# Patient Record
Sex: Male | Born: 1950 | Race: White | Hispanic: No | Marital: Married | Smoking: Former smoker
Health system: Southern US, Community
[De-identification: ages and names within clinical notes are randomized; demographics above are authoritative.]

## PROBLEM LIST (undated history)

## (undated) DIAGNOSIS — F419 Anxiety disorder, unspecified: Secondary | ICD-10-CM

## (undated) DIAGNOSIS — R112 Nausea with vomiting, unspecified: Secondary | ICD-10-CM

## (undated) DIAGNOSIS — G4730 Sleep apnea, unspecified: Secondary | ICD-10-CM

## (undated) DIAGNOSIS — Z8709 Personal history of other diseases of the respiratory system: Secondary | ICD-10-CM

## (undated) DIAGNOSIS — R51 Headache: Secondary | ICD-10-CM

## (undated) DIAGNOSIS — Z9889 Other specified postprocedural states: Secondary | ICD-10-CM

## (undated) DIAGNOSIS — I10 Essential (primary) hypertension: Secondary | ICD-10-CM

## (undated) DIAGNOSIS — R519 Headache, unspecified: Secondary | ICD-10-CM

## (undated) DIAGNOSIS — Z9109 Other allergy status, other than to drugs and biological substances: Secondary | ICD-10-CM

## (undated) DIAGNOSIS — C61 Malignant neoplasm of prostate: Secondary | ICD-10-CM

## (undated) DIAGNOSIS — Z87442 Personal history of urinary calculi: Secondary | ICD-10-CM

## (undated) HISTORY — PX: WISDOM TOOTH EXTRACTION: SHX21

## (undated) HISTORY — PX: UPPER GI ENDOSCOPY: SHX6162

## (undated) HISTORY — PX: NASAL SINUS SURGERY: SHX719

## (undated) HISTORY — PX: PROSTATE BIOPSY: SHX241

## (undated) HISTORY — PX: COLONOSCOPY: SHX174

---
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
## 1999-05-23 ENCOUNTER — Encounter: Payer: Self-pay | Admitting: *Deleted

## 1999-05-23 ENCOUNTER — Encounter: Admission: RE | Admit: 1999-05-23 | Discharge: 1999-05-23 | Payer: Self-pay | Admitting: *Deleted

## 1999-06-05 ENCOUNTER — Encounter: Payer: Self-pay | Admitting: *Deleted

## 1999-06-05 ENCOUNTER — Encounter: Admission: RE | Admit: 1999-06-05 | Discharge: 1999-06-05 | Payer: Self-pay | Admitting: *Deleted

## 2003-09-16 ENCOUNTER — Ambulatory Visit (HOSPITAL_COMMUNITY): Admission: RE | Admit: 2003-09-16 | Discharge: 2003-09-16 | Payer: Self-pay | Admitting: Gastroenterology

## 2007-06-05 ENCOUNTER — Encounter (INDEPENDENT_AMBULATORY_CARE_PROVIDER_SITE_OTHER): Payer: Self-pay | Admitting: Internal Medicine

## 2007-06-05 ENCOUNTER — Ambulatory Visit (HOSPITAL_COMMUNITY): Admission: RE | Admit: 2007-06-05 | Discharge: 2007-06-05 | Payer: Self-pay | Admitting: Internal Medicine

## 2010-09-21 NOTE — Op Note (Signed)
NAME:  Blake Ward, Blake Ward                         ACCOUNT NO.:  [id number]   MEDICAL RECORD NO.:  [id number]                   PATIENT TYPE:  AMB   LOCATION:  ENDO                                 FACILITY:  [facility]   PHYSICIAN:  John C. Madilyn Fireman, M.D.                 DATE OF BIRTH:  [birth year]   DATE OF PROCEDURE:  09/16/2003  DATE OF DISCHARGE:                                 OPERATIVE REPORT   PROCEDURE:  Colonoscopy.   ENDOSCOPIST:  Everardo All. Madilyn Fireman, M.D.   INDICATIONS FOR PROCEDURE:  Average risk colon cancer screening in a 60-year-  old patient with occasional rectal bleeding.   DESCRIPTION OF PROCEDURE:  The patient was placed in the left lateral  decubitus position and placed on the pulse monitor with the continuous low-  flow oxygen delivered by a nasal cannula.  He was sedated with 80 mcg of IV  fentanyl and 8 mg of IV Versed.  The Olympus video colonoscope was inserted  into the rectum and advanced to the cecum, confirmed by a transillumination  of McBurney's point and visualization of the ileocecal valve and appendiceal  orifice.  The prep was excellent.  The cecum, ascending, transverse,  descending and sigmoid colon all appeared normal, with no masses, polyps,  diverticula, or other mucosal abnormalities.  The rectum likewise appeared  normal.  The retroflexed view of the anus did reveals some small internal  hemorrhoids.  The scope was then withdrawn, and the patient returned to the  recovery room in stable condition.  He tolerated the procedure well, and  there were no immediate complications.   IMPRESSION:  Internal hemorrhoids, otherwise normal study.   PLAN:  The next colon screening plus a sigmoidoscopy in five years.                                               John C. Madilyn Fireman, M.D.    [facility]  D:  09/16/2003  T:  09/17/2003  Job:  [id number]   cc:   Gwen Pounds, M.D.  [address]  [place]  [place] [address]  Fax: [phone]

## 2011-04-06 DEATH — deceased

## 2017-04-29 ENCOUNTER — Other Ambulatory Visit: Payer: Self-pay

## 2017-04-29 ENCOUNTER — Encounter (HOSPITAL_BASED_OUTPATIENT_CLINIC_OR_DEPARTMENT_OTHER): Payer: Self-pay | Admitting: Emergency Medicine

## 2017-04-29 ENCOUNTER — Emergency Department (HOSPITAL_BASED_OUTPATIENT_CLINIC_OR_DEPARTMENT_OTHER)
Admission: EM | Admit: 2017-04-29 | Discharge: 2017-04-29 | Disposition: A | Discharge status: Home / Self Care | Payer: Medicare Other | Attending: Emergency Medicine | Admitting: Emergency Medicine

## 2017-04-29 ENCOUNTER — Emergency Department (HOSPITAL_BASED_OUTPATIENT_CLINIC_OR_DEPARTMENT_OTHER): Payer: Medicare Other

## 2017-04-29 DIAGNOSIS — R51 Headache: Secondary | ICD-10-CM | POA: Insufficient documentation

## 2017-04-29 DIAGNOSIS — Z79899 Other long term (current) drug therapy: Secondary | ICD-10-CM | POA: Diagnosis not present

## 2017-04-29 DIAGNOSIS — Z8546 Personal history of malignant neoplasm of prostate: Secondary | ICD-10-CM | POA: Diagnosis not present

## 2017-04-29 DIAGNOSIS — I10 Essential (primary) hypertension: Secondary | ICD-10-CM | POA: Insufficient documentation

## 2017-04-29 DIAGNOSIS — R519 Headache, unspecified: Secondary | ICD-10-CM

## 2017-04-29 HISTORY — DX: Essential (primary) hypertension: I10

## 2017-04-29 HISTORY — DX: Other allergy status, other than to drugs and biological substances: Z91.09

## 2017-04-29 LAB — CBC WITH DIFFERENTIAL/PLATELET
BASOS ABS: 0 10*3/uL (ref 0.0–0.1)
BASOS PCT: 0 %
EOS PCT: 0 %
Eosinophils Absolute: 0 10*3/uL (ref 0.0–0.7)
HEMATOCRIT: 41.2 % (ref 39.0–52.0)
Hemoglobin: 14.6 g/dL (ref 13.0–17.0)
LYMPHS PCT: 12 %
Lymphs Abs: 1.2 10*3/uL (ref 0.7–4.0)
MCH: 31.5 pg (ref 26.0–34.0)
MCHC: 35.4 g/dL (ref 30.0–36.0)
MCV: 88.8 fL (ref 78.0–100.0)
Monocytes Absolute: 0.3 10*3/uL (ref 0.1–1.0)
Monocytes Relative: 3 %
NEUTROS ABS: 8 10*3/uL — AB (ref 1.7–7.7)
Neutrophils Relative %: 85 %
PLATELETS: 213 10*3/uL (ref 150–400)
RBC: 4.64 MIL/uL (ref 4.22–5.81)
RDW: 12.4 % (ref 11.5–15.5)
WBC: 9.5 10*3/uL (ref 4.0–10.5)

## 2017-04-29 LAB — BASIC METABOLIC PANEL
ANION GAP: 10 (ref 5–15)
BUN: 9 mg/dL (ref 6–20)
CO2: 26 mmol/L (ref 22–32)
Calcium: 9.2 mg/dL (ref 8.9–10.3)
Chloride: 98 mmol/L — ABNORMAL LOW (ref 101–111)
Creatinine, Ser: 0.67 mg/dL (ref 0.61–1.24)
GLUCOSE: 152 mg/dL — AB (ref 65–99)
POTASSIUM: 3.2 mmol/L — AB (ref 3.5–5.1)
Sodium: 134 mmol/L — ABNORMAL LOW (ref 135–145)

## 2017-04-29 LAB — SEDIMENTATION RATE: Sed Rate: 8 mm/hr (ref 0–16)

## 2017-04-29 MED ORDER — IOPAMIDOL (ISOVUE-370) INJECTION 76%
100.0000 mL | Freq: Once | INTRAVENOUS | Status: AC | PRN
  Administered 2017-04-29: 100 mL via INTRAVENOUS

## 2017-04-29 MED ORDER — DIPHENHYDRAMINE HCL 50 MG/ML IJ SOLN
25.0000 mg | Freq: Once | INTRAMUSCULAR | Status: AC
  Administered 2017-04-29: 25 mg via INTRAVENOUS
  Filled 2017-04-29: qty 1

## 2017-04-29 MED ORDER — METOCLOPRAMIDE HCL 5 MG/ML IJ SOLN
10.0000 mg | Freq: Once | INTRAMUSCULAR | Status: AC
  Administered 2017-04-29: 10 mg via INTRAVENOUS
  Filled 2017-04-29: qty 2

## 2017-04-29 MED ORDER — VALPROATE SODIUM 500 MG/5ML IV SOLN
500.0000 mg | Freq: Once | INTRAVENOUS | Status: AC
  Administered 2017-04-29: 500 mg via INTRAVENOUS
  Filled 2017-04-29: qty 5

## 2017-04-29 MED ORDER — VALPROATE SODIUM 500 MG/5ML IV SOLN
INTRAVENOUS | Status: DC
Start: 2017-04-29 — End: 2017-04-29
  Filled 2017-04-29: qty 5

## 2017-04-29 MED ORDER — HYDRALAZINE HCL 20 MG/ML IJ SOLN
INTRAMUSCULAR | Status: DC
Start: 2017-04-29 — End: 2017-04-29
  Filled 2017-04-29: qty 1

## 2017-04-29 MED ORDER — HYDRALAZINE HCL 20 MG/ML IJ SOLN
10.0000 mg | Freq: Once | INTRAMUSCULAR | Status: DC
  Filled 2017-04-29: qty 1

## 2017-04-29 MED ORDER — HYDRALAZINE HCL 20 MG/ML IJ SOLN
10.0000 mg | Freq: Once | INTRAMUSCULAR | Status: AC
  Administered 2017-04-29: 10 mg via INTRAVENOUS
  Filled 2017-04-29: qty 1

## 2017-04-29 NOTE — ED Triage Notes (Signed)
Patient has had a Headache and dizziness with nausea for the last few days - patient states that nothing is helping with the Headaches now. The patient reports that the OTC medications have not worked recently

## 2017-04-29 NOTE — ED Provider Notes (Signed)
[facility] EMERGENCY DEPARTMENT Provider Note   CSN: [id number] Arrival date & time: 04/29/17  1453     History   Chief Complaint Chief Complaint  Patient presents with  . Headache    HPI Blake Ward is a 66 y.o. male. Chief complaint is headache  HPI:  66 year old male. He reports having headaches for the last 2 months. Global. Not unilateral. No vision changes. No numbness weakness or tingling to extremities. Occasionally he will feel dizziness described as "mild". However he denies lightheadedness, presyncope, or vertigo symptoms. No sudden onset of headache. States his headache is been worse the last 2 days. No neck pain or stiffness. No fever. Global, circumferential. He rates it 8 out of 10 today.  History of hypertension. He is on atenolol, and hydrochlorothiazide.  Past Medical History:  Diagnosis Date  . Cancer [facility])    prostate  . Environmental allergies   . Hypertension     There are no active problems to display for this patient.   History reviewed. No pertinent surgical history.     Home Medications    Prior to Admission medications   Medication Sig Start Date End Date Taking? Authorizing Provider  atenolol (TENORMIN) 100 MG tablet Take 100 mg by mouth daily.   Yes [provider]  metoprolol-hydrochlorothiazide (LOPRESSOR HCT) 50-25 MG tablet Take 1 tablet by mouth daily.   Yes [provider]    Family History History reviewed. No pertinent family history.  Social History Social History   Tobacco Use  . Smoking status: Never Smoker  . Smokeless tobacco: Never Used  Substance Use Topics  . Alcohol use: No    Frequency: Never  . Drug use: No     Allergies   Patient has no known allergies.   Review of Systems Review of Systems  Constitutional: Negative for appetite change, chills, diaphoresis, fatigue and fever.  HENT: Negative for mouth sores, sore throat and trouble swallowing.   Eyes: Negative for  visual disturbance.  Respiratory: Negative for cough, chest tightness, shortness of breath and wheezing.   Cardiovascular: Negative for chest pain.  Gastrointestinal: Negative for abdominal distention, abdominal pain, diarrhea, nausea and vomiting.  Endocrine: Negative for polydipsia, polyphagia and polyuria.  Genitourinary: Negative for dysuria, frequency and hematuria.  Musculoskeletal: Negative for gait problem.  Skin: Negative for color change, pallor and rash.  Neurological: Positive for dizziness and headaches. Negative for syncope and light-headedness.  Hematological: Does not bruise/bleed easily.  Psychiatric/Behavioral: Negative for behavioral problems and confusion.     Physical Exam Updated Vital Signs BP (!) 166/84   Pulse 76   Temp 98.7 F (37.1 C) (Oral)   Resp 18   Ht 6' (1.829 m)   Wt 90.7 kg (200 lb)   SpO2 99%   BMI 27.12 kg/m   Physical Exam  Constitutional: He is oriented to person, place, and time. He appears well-developed and well-nourished. No distress.  HENT:  Head: Normocephalic.  Normal scalp. Nontender over the temporal pulses. No erythema or rash. Neck supple. Normal cranial nerve exam.  Eyes: Conjunctivae are normal. Pupils are equal, round, and reactive to light. No scleral icterus.  Neck: Normal range of motion. Neck supple. No thyromegaly present.  Cardiovascular: Normal rate and regular rhythm. Exam reveals no gallop and no friction rub.  No murmur heard. Pulmonary/Chest: Effort normal and breath sounds normal. No respiratory distress. He has no wheezes. He has no rales.  Abdominal: Soft. Bowel sounds are normal. He exhibits no  distension. There is no tenderness. There is no rebound.  Musculoskeletal: Normal range of motion.  Neurological: He is alert and oriented to person, place, and time.  Skin: Skin is warm and dry. No rash noted.  Psychiatric: He has a normal mood and affect. His behavior is normal.     ED Treatments / Results   Labs (all labs ordered are listed, but only abnormal results are displayed) Labs Reviewed  CBC WITH DIFFERENTIAL/PLATELET - Abnormal; Notable for the following components:      Result Value   Neutro Abs 8.0 (*)    All other components within normal limits  BASIC METABOLIC PANEL - Abnormal; Notable for the following components:   Sodium 134 (*)    Potassium 3.2 (*)    Chloride 98 (*)    Glucose, Bld 152 (*)    All other components within normal limits  SEDIMENTATION RATE    EKG  EKG Interpretation  Date/Time:  Tuesday April 29 2017 15:10:59 EST Ventricular Rate:  57 PR Interval:    QRS Duration: 152 QT Interval:  489 QTC Calculation: 477 R Axis:   -74 Text Interpretation:  Sinus rhythm RBBB and LAFB Confirmed by Tanna Furry [phone] on 04/29/2017 3:38:48 PM       Radiology Ct Angio Head W Or Wo Contrast  Result Date: 04/29/2017 CLINICAL DATA:  Headache with dizziness and nausea for a few days. Cerebral hemorrhage suspected. EXAM: CT ANGIOGRAPHY HEAD TECHNIQUE: Multidetector CT imaging of the head was performed using the standard protocol during bolus administration of intravenous contrast. Multiplanar CT image reconstructions and MIPs were obtained to evaluate the vascular anatomy. CONTRAST:  186mL ISOVUE-370 IOPAMIDOL (ISOVUE-370) INJECTION 76% COMPARISON:  None. FINDINGS: CT HEAD Brain: No evidence of acute infarction, hemorrhage, hydrocephalus, extra-axial collection or mass lesion/mass effect. Patchy low-density in the cerebral white matter best attributed to chronic small vessel ischemia in this patient with history of hypertension. Vascular: See below Skull: No acute or aggressive finding Sinuses: Prior endoscopic sinus surgery. Maxillary sinuses are atelectatic. There are polypoid masses in the bilateral nasal cavity, left more than right, and in the frontal sinuses and ethmoid remnants. No fluid levels. Orbits: Negative CTA HEAD Anterior circulation: No branch occlusion,  stenosis, or beading. No evidence of aneurysm or vascular malformation. Posterior circulation: Hypoplastic/aplastic left P1 segment. Vessels are smooth and widely patent. Mild atherosclerotic plaque on the right V4 segment. Venous sinuses: Patent Anatomic variants: As above Delayed phase: No abnormal intracranial enhancement. IMPRESSION: 1. No acute finding to explain headache. 2. White matter disease, likely chronic small vessel ischemia. 3. Mild atherosclerotic plaque. 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 Electronically Signed   By: Monte Fantasia M.D.   On: 04/29/2017 17:03   Ct Head Wo Contrast  Result Date: 04/29/2017 CLINICAL DATA:  Headache with dizziness and nausea for a few days. Cerebral hemorrhage suspected. EXAM: CT ANGIOGRAPHY HEAD TECHNIQUE: Multidetector CT imaging of the head was performed using the standard protocol during bolus administration of intravenous contrast. Multiplanar CT image reconstructions and MIPs were obtained to evaluate the vascular anatomy. CONTRAST:  121mL ISOVUE-370 IOPAMIDOL (ISOVUE-370) INJECTION 76% COMPARISON:  None. FINDINGS: CT HEAD Brain: No evidence of acute infarction, hemorrhage, hydrocephalus, extra-axial collection or mass lesion/mass effect. Patchy low-density in the cerebral white matter best attributed to chronic small vessel ischemia in this patient with history of hypertension. Vascular: See below Skull: No acute or aggressive finding Sinuses: Prior endoscopic sinus surgery. Maxillary sinuses are atelectatic. There are polypoid masses  in the bilateral nasal cavity, left more than right, and in the frontal sinuses and ethmoid remnants. No fluid levels. Orbits: Negative CTA HEAD Anterior circulation: No branch occlusion, stenosis, or beading. No evidence of aneurysm or vascular malformation. Posterior circulation: Hypoplastic/aplastic left P1 segment. Vessels are smooth and widely patent. Mild atherosclerotic plaque on the right V4  segment. Venous sinuses: Patent Anatomic variants: As above Delayed phase: No abnormal intracranial enhancement. IMPRESSION: 1. No acute finding to explain headache. 2. White matter disease, likely chronic small vessel ischemia. 3. Mild atherosclerotic plaque. 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 Electronically Signed   By: Monte Fantasia M.D.   On: 04/29/2017 17:03    Procedures Procedures (including critical care time)  Medications Ordered in ED Medications  valproate (DEPACON) 500 MG/5ML injection (not administered)  hydrALAZINE (APRESOLINE) 20 MG/ML injection (not administered)  valproate (DEPACON) 500 MG/5ML injection (not administered)  metoCLOPramide (REGLAN) injection 10 mg (10 mg Intravenous Given 04/29/17 1550)  diphenhydrAMINE (BENADRYL) injection 25 mg (25 mg Intravenous Given 04/29/17 1547)  valproate (DEPACON) 500 mg in dextrose 5 % 50 mL IVPB (500 mg Intravenous New Bag/Given 04/29/17 1612)  hydrALAZINE (APRESOLINE) injection 10 mg (10 mg Intravenous Given 04/29/17 1600)  iopamidol (ISOVUE-370) 76 % injection 100 mL (100 mLs Intravenous Contrast Given 04/29/17 1623)     Initial Impression / Assessment and Plan / ED Course  I have reviewed the triage vital signs and the nursing notes.  Pertinent labs & imaging results that were available during my care of the patient were reviewed by me and considered in my medical decision making (see chart for details).    Headache. 2 months of headache with 48 hours worsening. Not highly suggestive of subarachnoid hemorrhage. No focal neurological deficits. No obvious or classic migraine symptoms. Plan imaging. If CT had normal plain CT angiogram. Symptomatic treatment. Reevaluation.  Final Clinical Impressions(s) / ED Diagnoses   Final diagnoses:  Acute nonintractable headache, unspecified headache type  Hypertension, unspecified type   Blood pressure improves the hydralazine 10 mg 1. Given Reglan, and  Depakote and his headache is down 22/10. CT his CT and your negative. Discussed with him that his CTs were reassuring. I'm not suspicious with the pretest probability that this is subarachnoid hemorrhage. His history of definite not consistently said headaches for several weeks and slowly worsening. No ill family members. Doubt carbon monoxide. Have asked him to check and record his blood pressure twice per day and to follow-up with his physician this week. He is taking new multiple supplements including tumor can others that he cannot recall. Asked him to discontinue all nonprescribed medications and double check restarting any of these with primary care. Return here if fevers or recurrent symptoms worsening symptoms.    ED Discharge Orders    None       Tanna Furry, MD 04/29/17 1724

## 2017-04-29 NOTE — Discharge Instructions (Signed)
Stop supplements. Check and record your blood pressure each morning and evening. Call your PCP for appointment this week.

## 2017-05-08 ENCOUNTER — Other Ambulatory Visit: Payer: Self-pay

## 2017-05-08 ENCOUNTER — Ambulatory Visit
Admission: RE | Admit: 2017-05-08 | Discharge: 2017-05-08 | Disposition: A | Discharge status: Home / Self Care | Payer: Medicare Other | Source: Ambulatory Visit | Attending: Radiation Oncology | Admitting: Radiation Oncology

## 2017-05-08 ENCOUNTER — Encounter: Payer: Self-pay | Admitting: Radiation Oncology

## 2017-05-08 ENCOUNTER — Encounter: Payer: Self-pay | Admitting: Medical Oncology

## 2017-05-08 VITALS — BP 124/69 | HR 64 | Resp 18 | Ht 72.0 in | Wt 202.0 lb

## 2017-05-08 DIAGNOSIS — I10 Essential (primary) hypertension: Secondary | ICD-10-CM | POA: Diagnosis not present

## 2017-05-08 DIAGNOSIS — C61 Malignant neoplasm of prostate: Secondary | ICD-10-CM | POA: Diagnosis not present

## 2017-05-08 DIAGNOSIS — Z79899 Other long term (current) drug therapy: Secondary | ICD-10-CM | POA: Diagnosis not present

## 2017-05-08 DIAGNOSIS — Z87891 Personal history of nicotine dependence: Secondary | ICD-10-CM | POA: Insufficient documentation

## 2017-05-08 DIAGNOSIS — F419 Anxiety disorder, unspecified: Secondary | ICD-10-CM | POA: Insufficient documentation

## 2017-05-08 HISTORY — DX: Malignant neoplasm of prostate: C61

## 2017-05-08 HISTORY — DX: Anxiety disorder, unspecified: F41.9

## 2017-05-08 NOTE — Progress Notes (Signed)
Radiation Oncology         [id number] ________________________________  Initial outpatient Consultation  Name: Blake Ward MRN: [id number]  Date: 05/08/2017  DOB: June 24, 1950  CC:Blake Ruff, MD  Blake Retort, MD   REFERRING PHYSICIAN: Nickie Retort, MD  DIAGNOSIS: 67 y.o. gentleman with stage T1c adenocarcinoma of the prostate with a Gleason's score of 3+4 and a PSA of 6.19    ICD-10-CM   1. Malignant neoplasm of prostate ([place]) C61     HISTORY OF PRESENT ILLNESS::Blake Ward is a 67 y.o. gentleman.  He was noted to have an elevated PSA of 4.1 by his primary care physician, Dr. Drema Ward in 06/2015.  He was referred for evaluation in Urology with Dr. Pilar Ward in March of 2017, digital rectal examination was performed at that time revealing symmetrical prostate lobes without nodularity and a repeat PSA was 3.51.  The decision was made at that time to postpone TRUSPBx and repeat PSA in 6 months.   A repeat PSA in September 2017 was 6.65. Despite the recommendation to proceed with TRUSPBx at that time, the patient elected to undergo surveillance, as his wife was undergoing multiple eye surgeries and medical issues. Repeat PSA in September 2018 showed a persistent elevation at 6.19 although DRE on 02/04/17 remained non-concerning.  The patient proceeded to transrectal ultrasound with 12 biopsies of the prostate on 04/15/17.  The prostate volume measured 32.58 cc.  Out of 12 core biopsies,2 were positive.  The maximum Gleason score was 3+4, and this was seen in left base lateral. There was a asingle focus of Gleason 3+3 disease in the right base lateral.    The patient reviewed the biopsy results with his urologist and he has kindly been referred today for discussion of potential radiation treatment options.  He is accompanied by his wife.   PREVIOUS RADIATION THERAPY: No  PAST MEDICAL HISTORY:  has a past medical history of Anxiety, Environmental allergies, Hypertension,  and Prostate cancer ([place]).    PAST SURGICAL HISTORY: Past Surgical History:  Procedure Laterality Date  . HERNIA REPAIR    . NOSE SURGERY    . PROSTATE BIOPSY      FAMILY HISTORY: family history includes Cancer in his brother. Brother died of colangiocarcinoma at age 106.  SOCIAL HISTORY:  reports that he quit smoking about 21 years ago. His smoking use included cigarettes. He has a 15.00 pack-year smoking history. he has never used smokeless tobacco. He reports that he does not drink alcohol or use drugs.  ALLERGIES: Patient has no known allergies.  MEDICATIONS:  Current Outpatient Medications  Medication Sig Dispense Refill  . atenolol (TENORMIN) 25 MG tablet     . DULoxetine (CYMBALTA) 30 MG capsule     . fluticasone (FLONASE) 50 MCG/ACT nasal spray     . hydrochlorothiazide (HYDRODIURIL) 25 MG tablet     . irbesartan (AVAPRO) 150 MG tablet Take 150 mg by mouth daily.  1   No current facility-administered medications for this encounter.     REVIEW OF SYSTEMS: On review of systems, the patient reports that he is doing well overall. He denies any chest pain, shortness of breath, cough, fevers, chills, night sweats, unintended weight changes. He denies any bowel or bladder disturbances, and denies abdominal pain, nausea or vomiting. He denies any new musculoskeletal or joint aches or pains, new skin lesions or concerns. The patient completed an IPSS and IIEF questionnaire.  His IPSS score was 1 indicating mild urinary outflow  obstructive symptoms.  He has severe ED with SHIM score of 5 but reports that this is not a priority at this time. A complete review of systems is obtained and is otherwise negative.     PHYSICAL EXAM:   height is 6' (1.829 m) and weight is 202 lb (91.6 kg). His blood pressure is 124/69 and his pulse is 64. His respiration is 18 and oxygen saturation is 99%.    In general this is a well appearing caucasian male in no acute distress. He is alert and oriented x4  and appropriate throughout the examination. HEENT reveals that the patient is normocephalic, atraumatic. EOMs are intact. PERRLA. Skin is intact without any evidence of gross lesions. Cardiovascular exam reveals a regular rate and rhythm, no clicks rubs or murmurs are auscultated. Chest is clear to auscultation bilaterally. Lymphatic assessment is performed and does not reveal any adenopathy in the cervical, supraclavicular, axillary, or inguinal chains. Abdomen has active bowel sounds in all quadrants and is intact. The abdomen is soft, non tender, non distended. Lower extremities are negative for pretibial pitting edema, deep calf tenderness, cyanosis or clubbing.  KPS = 100  100 - Normal; no complaints; no evidence of disease. 90   - Able to carry on normal activity; minor signs or symptoms of disease. 80   - Normal activity with effort; some signs or symptoms of disease. 81   - Cares for self; unable to carry on normal activity or to do active work. 60   - Requires occasional assistance, but is able to care for most of his personal needs. 50   - Requires considerable assistance and frequent medical care. 12   - Disabled; requires special care and assistance. 74   - Severely disabled; hospital admission is indicated although death not imminent. 55   - Very sick; hospital admission necessary; active supportive treatment necessary. 10   - Moribund; fatal processes progressing rapidly. 0     - Dead  Karnofsky DA, Abelmann [place], Craver LS and Burchenal [facility] [phone]) The use of the nitrogen mustards in the palliative treatment of carcinoma: with particular reference to bronchogenic carcinoma Cancer 1 634-56   LABORATORY DATA:  Lab Results  Component Value Date   WBC 9.5 04/29/2017   HGB 14.6 04/29/2017   HCT 41.2 04/29/2017   MCV 88.8 04/29/2017   PLT 213 04/29/2017   Lab Results  Component Value Date   NA 134 (L) 04/29/2017   K 3.2 (L) 04/29/2017   CL 98 (L) 04/29/2017   CO2 26 04/29/2017     No results found for: ALT, AST, GGT, ALKPHOS, BILITOT   RADIOGRAPHY: Ct Angio Head W Or Wo Contrast  Result Date: 04/29/2017 CLINICAL DATA:  Headache with dizziness and nausea for a few days. Cerebral hemorrhage suspected. EXAM: CT ANGIOGRAPHY HEAD TECHNIQUE: Multidetector CT imaging of the head was performed using the standard protocol during bolus administration of intravenous contrast. Multiplanar CT image reconstructions and MIPs were obtained to evaluate the vascular anatomy. CONTRAST:  148mL ISOVUE-370 IOPAMIDOL (ISOVUE-370) INJECTION 76% COMPARISON:  None. FINDINGS: CT HEAD Brain: No evidence of acute infarction, hemorrhage, hydrocephalus, extra-axial collection or mass lesion/mass effect. Patchy low-density in the cerebral white matter best attributed to chronic small vessel ischemia in this patient with history of hypertension. Vascular: See below Skull: No acute or aggressive finding Sinuses: Prior endoscopic sinus surgery. Maxillary sinuses are atelectatic. There are polypoid masses in the bilateral nasal cavity, left more than right, and in the frontal sinuses  and ethmoid remnants. No fluid levels. Orbits: Negative CTA HEAD Anterior circulation: No branch occlusion, stenosis, or beading. No evidence of aneurysm or vascular malformation. Posterior circulation: Hypoplastic/aplastic left P1 segment. Vessels are smooth and widely patent. Mild atherosclerotic plaque on the right V4 segment. Venous sinuses: Patent Anatomic variants: As above Delayed phase: No abnormal intracranial enhancement. IMPRESSION: 1. No acute finding to explain headache. 2. White matter disease, likely chronic small vessel ischemia. 3. Mild atherosclerotic plaque. 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 Electronically Signed   By: Monte Fantasia M.D.   On: 04/29/2017 17:03   Ct Head Wo Contrast  Result Date: 04/29/2017 CLINICAL DATA:  Headache with dizziness and nausea for a few days. Cerebral  hemorrhage suspected. EXAM: CT ANGIOGRAPHY HEAD TECHNIQUE: Multidetector CT imaging of the head was performed using the standard protocol during bolus administration of intravenous contrast. Multiplanar CT image reconstructions and MIPs were obtained to evaluate the vascular anatomy. CONTRAST:  187mL ISOVUE-370 IOPAMIDOL (ISOVUE-370) INJECTION 76% COMPARISON:  None. FINDINGS: CT HEAD Brain: No evidence of acute infarction, hemorrhage, hydrocephalus, extra-axial collection or mass lesion/mass effect. Patchy low-density in the cerebral white matter best attributed to chronic small vessel ischemia in this patient with history of hypertension. Vascular: See below Skull: No acute or aggressive finding Sinuses: Prior endoscopic sinus surgery. Maxillary sinuses are atelectatic. There are polypoid masses in the bilateral nasal cavity, left more than right, and in the frontal sinuses and ethmoid remnants. No fluid levels. Orbits: Negative CTA HEAD Anterior circulation: No branch occlusion, stenosis, or beading. No evidence of aneurysm or vascular malformation. Posterior circulation: Hypoplastic/aplastic left P1 segment. Vessels are smooth and widely patent. Mild atherosclerotic plaque on the right V4 segment. Venous sinuses: Patent Anatomic variants: As above Delayed phase: No abnormal intracranial enhancement. IMPRESSION: 1. No acute finding to explain headache. 2. White matter disease, likely chronic small vessel ischemia. 3. Mild atherosclerotic plaque. 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 Electronically Signed   By: Monte Fantasia M.D.   On: 04/29/2017 17:03      IMPRESSION: This gentleman is 67 years old with stage T1c adenocarcinoma of the prostate, a Gleason's score of 3+4 and a PSA of 6.19.  His T-Stage, Gleason's Score, and PSA put him into the favorable intermediate risk group.  Accordingly he is eligible for a variety of potential treatment options including external beam radiation,  brachytherapy, and prostatectomy.  PLAN: Today, we reviewed the findings and workup thus far.  We discussed the natural history of prostate cancer.  We reviewed the the implications of T-stage, Gleason's Score, and PSA on decision-making and outcomes in prostate cancer.  We discussed radiation treatment in the management of prostate cancer with regard to the logistics and delivery of external beam radiation treatment as well as the logistics and delivery of prostate brachytherapy.  We also discussed the role of SpaceOAR in reducing the rectal toxicity associated with radiotherapy. We compared and contrasted each of these approaches and also compared these against prostatectomy.  The patient is interested in prostate brachytherapy with SpaceOAR but is still considering prostatectomy.  He would like some additional time to consider his options before committing.    We will share our findings with Dr. Pilar Ward and will plan to follow up by phone in the next 2 weeks to answer any additional questions that he may have and assess his readiness to commit to treatment at that time.  We enjoyed meeting with him and his wife today, and  will look forward to participating in the care of this very nice gentleman.  We spent 60 minutes face to face with the patient and more than 50% of that time was spent in counseling and/or coordination of care.     Nicholos Johns, PA-C    Tyler Pita, MD  [facility] Direct Dial: [phone]  Fax: [phone] [email or web address]  Skype  LinkedIn    Page Me      This document serves as a record of services personally performed by Tyler Pita, MD and Freeman Caldron, PA-C. It was created on their behalf by Bethann Humble, a trained medical scribe. The creation of this record is based on the scribe's personal observations and the provider's statements to them. This document has been checked and approved by the attending provider.

## 2017-05-08 NOTE — Progress Notes (Signed)
See progress note under physician encounter. 

## 2017-05-08 NOTE — Progress Notes (Signed)
GU Location of Tumor / Histology: prostatic adenocarcinoma  If Prostate Cancer, Gleason Score is (3 + 4) and PSA is (6.19). Prostate volume: 32.58 grams.  Leontine Locket was referred by Dr. [name] to Dr. Pilar Jarvis in October 2018 for evaluation of an elevated PSA.   Sept. 2018  PSA  6.19 Sept 2017  PSA  6.65 March 2017  PSA  3.51  Biopsies of prostate (if applicable) revealed:    Past/Anticipated interventions by urology, if any: biopsy, referral to Dr. Tammi Klippel for consideration of radiation therapy  Past/Anticipated interventions by medical oncology, if any: no  Weight changes, if any: no  Bowel/Bladder complaints, if any: IPSS 1 with nocturia. Denies dysuria, hematuria, urinary leakage or incontinence.   Nausea/Vomiting, if any: no  Pain issues, if any:  no  SAFETY ISSUES:  Prior radiation? no  Pacemaker/ICD? no  Possible current pregnancy? no  Is the patient on methotrexate? no  Current Complaints / other details:  68 year old male. Married with one daughter.   Seeds vs. Surgery.

## 2017-06-06 ENCOUNTER — Telehealth: Payer: Self-pay | Admitting: Urology

## 2017-06-06 NOTE — Telephone Encounter (Signed)
I spoke with the patient again today in follow up regarding his decision for prostate cancer treatment.  I had spoken with him 2 weeks earlier and he was still undecided and today he reports that he remains undecided.  He is consulting with multiple acquaintances that have been treated for prostate cancer to gather further information.  He has a scheduled f/u visit with Dr. Pilar Jarvis 06/18/17 and is hopeful that he will be able to make a final decision for treatment at that time but is really looking for Dr. Pilar Jarvis to make a recommendation one way or the other as to whether he should have prostatectomy or brachytherapy.  At this time, he is leaning towards having prostatectomy because he likes the idea of having radiotherapy as a back up option in the future should he have a recurrence of his disease.  I advised that I will follow up with him by phone again sometime in the week of 06/23/17 but that he is welcome to call anytime in the interim if he reaches a decision sooner or has further questions.   Nicholos Johns, PA-C

## 2017-06-26 ENCOUNTER — Telehealth: Payer: Self-pay | Admitting: Medical Oncology

## 2017-06-26 NOTE — Telephone Encounter (Signed)
Left a message requesting a return call to discuss his decision for treatment of prostate cancer.

## 2017-07-01 ENCOUNTER — Telehealth: Payer: Self-pay | Admitting: Medical Oncology

## 2017-07-01 NOTE — Telephone Encounter (Signed)
Spoke with Blake Ward regarding his treatment decision. He states that Dr. Tammi Klippel, Ashlyn and Dr. Pilar Jarvis have been great discussing treatment options but he's frustrated because he has multiple treatment options. He has many friends that have been treated for prostate cancer with various treatments. He has discussed their outcomes and most have done well. We discussed his treatment options again as well as his PSA and pathology report. He has follow up with Dr. Pilar Jarvis 07/21/17 and will discuss the cure rate between surgery and brachytherapy. If  Dr. Pilar Jarvis agrees they are equal he is going with brachytherapy. I asked him to call me or Ashlyn with his decision. He voiced understanding.

## 2017-07-08 ENCOUNTER — Telehealth: Payer: Self-pay | Admitting: Urology

## 2017-07-08 NOTE — Telephone Encounter (Signed)
I spoke with this patient today by phone and he has reached his decision regarding treatment of his prostate cancer.  He wishes to move forward with prostate brachytherapy with SpaceOAR.  An inbox message was sent to Romie Jumper requesting that she move forward with coordinating this procedure with Dr. Carlynn Purl surgery schedulers.  The patient would like to have this procedure as soon as possible as he has a family vacation planned for mid June that he would like to be available for.  This information hs been shared with Dr. Pilar Jarvis as well. Ailene Ards

## 2017-08-13 ENCOUNTER — Telehealth: Payer: Self-pay | Admitting: *Deleted

## 2017-08-13 NOTE — Telephone Encounter (Signed)
CALLED PATIENT TO REMIND OF PRE-SEED APPTS. FOR 08-14-17, SPOKE WITH PATIENT AND HE IS AWARE OF THESE APPTS.

## 2017-08-14 ENCOUNTER — Ambulatory Visit
Admission: RE | Admit: 2017-08-14 | Discharge: 2017-08-14 | Disposition: A | Discharge status: Home / Self Care | Payer: Medicare Other | Source: Ambulatory Visit | Attending: Radiation Oncology | Admitting: Radiation Oncology

## 2017-08-14 ENCOUNTER — Encounter: Payer: Self-pay | Admitting: Medical Oncology

## 2017-08-14 DIAGNOSIS — Z923 Personal history of irradiation: Secondary | ICD-10-CM | POA: Diagnosis not present

## 2017-08-14 DIAGNOSIS — C61 Malignant neoplasm of prostate: Secondary | ICD-10-CM | POA: Diagnosis not present

## 2017-08-14 NOTE — Progress Notes (Signed)
  Radiation Oncology         [phone]) [phone] ________________________________  Name: Blake Ward MRN: [id number]  Date: 08/14/2017  DOB: 11-09-1950  SIMULATION AND TREATMENT PLANNING NOTE PUBIC ARCH STUDY  QT:MAUQJF, Benjamine Mola, MD  Nickie Retort, MD  DIAGNOSIS: 67 y.o. male with stage T1c adenocarcinoma of the prostate with a Gleason's score of 3+4 and a PSA of 6.19     ICD-10-CM   1. Malignant neoplasm of prostate ([place]) C61     COMPLEX SIMULATION:  The patient presented today for evaluation for possible prostate seed implant. He was brought to the radiation planning suite and placed supine on the CT couch. A 3-dimensional image study set was obtained in upload to the planning computer. There, on each axial slice, I contoured the prostate gland. Then, using three-dimensional radiation planning tools I reconstructed the prostate in view of the structures from the transperineal needle pathway to assess for possible pubic arch interference. In doing so, I did not appreciate any pubic arch interference. Also, the patient's prostate volume was estimated based on the drawn structure. The volume was 42 cc.  Given the pubic arch appearance and prostate volume, patient remains a good candidate to proceed with prostate seed implant. Today, he freely provided informed written consent to proceed.    PLAN: The patient will undergo prostate seed implant.   ________________________________  Sheral Apley. Tammi Klippel, M.D.  This document serves as a record of services personally performed by Tyler Pita, MD. It was created on his behalf by Rae Lips, a trained medical scribe. The creation of this record is based on the scribe's personal observations and the provider's statements to them. This document has been checked and approved by the attending provider.

## 2017-08-18 ENCOUNTER — Ambulatory Visit (HOSPITAL_COMMUNITY)
Admission: RE | Admit: 2017-08-18 | Discharge: 2017-08-18 | Disposition: A | Discharge status: Home / Self Care | Payer: Medicare Other | Source: Ambulatory Visit | Attending: Urology | Admitting: Urology

## 2017-08-18 ENCOUNTER — Other Ambulatory Visit: Payer: Self-pay | Admitting: Urology

## 2017-08-18 ENCOUNTER — Encounter (HOSPITAL_COMMUNITY)
Admission: RE | Admit: 2017-08-18 | Discharge: 2017-08-18 | Disposition: A | Discharge status: Home / Self Care | Payer: Medicare Other | Source: Ambulatory Visit | Attending: Urology | Admitting: Urology

## 2017-08-18 DIAGNOSIS — Z01818 Encounter for other preprocedural examination: Secondary | ICD-10-CM

## 2017-08-18 DIAGNOSIS — I444 Left anterior fascicular block: Secondary | ICD-10-CM | POA: Diagnosis not present

## 2017-08-18 DIAGNOSIS — I452 Bifascicular block: Secondary | ICD-10-CM | POA: Insufficient documentation

## 2017-08-18 DIAGNOSIS — I4510 Unspecified right bundle-branch block: Secondary | ICD-10-CM | POA: Insufficient documentation

## 2017-09-05 ENCOUNTER — Other Ambulatory Visit: Payer: Self-pay | Admitting: Urology

## 2017-09-05 DIAGNOSIS — C61 Malignant neoplasm of prostate: Secondary | ICD-10-CM

## 2017-09-12 ENCOUNTER — Encounter (HOSPITAL_BASED_OUTPATIENT_CLINIC_OR_DEPARTMENT_OTHER): Payer: Self-pay

## 2017-09-12 ENCOUNTER — Telehealth: Payer: Self-pay | Admitting: *Deleted

## 2017-09-12 ENCOUNTER — Other Ambulatory Visit: Payer: Self-pay

## 2017-09-12 NOTE — Telephone Encounter (Signed)
CALLED PATIENT TO REMIND OF LABS FOR 09-15-17 @ 2 PM @ [facility]ITT[facility] , SPOKE WITH PATIENT AND HE IS AWARE OF THIS APPT.

## 2017-09-12 NOTE — Progress Notes (Signed)
Spoke with:  Legrand Como NPO:  After Midnight, no gum, candy, or mints   Arrival time:  0730AM Labs/Procedures:  EKG/CXR 08/18/2017, CBC, CMP, PT, PTT 09/15/2017 in epic AM medications:  Fleet enema, Duloxetine, Flonase Pre op orders:  Yes Ride home:  Juliann Pulse (wife) [phone]

## 2017-09-15 ENCOUNTER — Encounter (HOSPITAL_COMMUNITY)
Admission: RE | Admit: 2017-09-15 | Discharge: 2017-09-15 | Disposition: A | Discharge status: Home / Self Care | Payer: Medicare Other | Source: Ambulatory Visit | Attending: Urology | Admitting: Urology

## 2017-09-15 DIAGNOSIS — Z01812 Encounter for preprocedural laboratory examination: Secondary | ICD-10-CM | POA: Diagnosis not present

## 2017-09-15 LAB — COMPREHENSIVE METABOLIC PANEL
ALT: 18 U/L (ref 17–63)
ANION GAP: 9 (ref 5–15)
AST: 24 U/L (ref 15–41)
Albumin: 3.9 g/dL (ref 3.5–5.0)
Alkaline Phosphatase: 59 U/L (ref 38–126)
BILIRUBIN TOTAL: 0.8 mg/dL (ref 0.3–1.2)
BUN: 9 mg/dL (ref 6–20)
CO2: 28 mmol/L (ref 22–32)
Calcium: 9.5 mg/dL (ref 8.9–10.3)
Chloride: 100 mmol/L — ABNORMAL LOW (ref 101–111)
Creatinine, Ser: 0.85 mg/dL (ref 0.61–1.24)
Glucose, Bld: 108 mg/dL — ABNORMAL HIGH (ref 65–99)
POTASSIUM: 4 mmol/L (ref 3.5–5.1)
Sodium: 137 mmol/L (ref 135–145)
TOTAL PROTEIN: 7 g/dL (ref 6.5–8.1)

## 2017-09-15 LAB — CBC
HCT: 40.2 % (ref 39.0–52.0)
Hemoglobin: 14 g/dL (ref 13.0–17.0)
MCH: 31.7 pg (ref 26.0–34.0)
MCHC: 34.8 g/dL (ref 30.0–36.0)
MCV: 91 fL (ref 78.0–100.0)
Platelets: 231 10*3/uL (ref 150–400)
RBC: 4.42 MIL/uL (ref 4.22–5.81)
RDW: 12.4 % (ref 11.5–15.5)
WBC: 5.1 10*3/uL (ref 4.0–10.5)

## 2017-09-15 LAB — PROTIME-INR
INR: 0.97
PROTHROMBIN TIME: 12.8 s (ref 11.4–15.2)

## 2017-09-15 LAB — APTT: aPTT: 30 seconds (ref 24–36)

## 2017-09-15 NOTE — Pre-Procedure Instructions (Signed)
CMP results 09/15/2017 faxed to Dr. Pilar Jarvis via epic.

## 2017-09-19 ENCOUNTER — Telehealth: Payer: Self-pay | Admitting: *Deleted

## 2017-09-19 NOTE — Telephone Encounter (Signed)
CALLED PATIENT TO REMIND OF IMPLANT FOR 09-22-17, SPOKE WITH PATIENT AND HE IS AWARE OF THIS PROCEDURE

## 2017-09-21 NOTE — Progress Notes (Signed)
  Radiation Oncology         [phone] ________________________________  Name: Blake Ward MRN: [id number]  Date: 09/21/2017  DOB: 11-03-1950       Prostate Seed Implant  CC:[name], MD  No ref. provider found  DIAGNOSIS: 67 y.o. gentleman with stage T1c adenocarcinoma of the prostate with a Gleason's score of 3+4 and a PSA of 6.19    ICD-10-CM   1. Preop testing Z01.818 DG Chest 2 View    DG Chest 2 View    PROCEDURE: Insertion of radioactive I-125 seeds into the prostate gland.  RADIATION DOSE: 145 Gy, definitive therapy.  TECHNIQUE: Blake Ward was brought to the operating room with the urologist. He was placed in the dorsolithotomy position. He was catheterized and a rectal tube was inserted. The perineum was shaved, prepped and draped. The ultrasound probe was then introduced into the rectum to see the prostate gland.  TREATMENT DEVICE: A needle grid was attached to the ultrasound probe stand and anchor needles were placed.  3D PLANNING: The prostate was imaged in 3D using a sagittal sweep of the prostate probe. These images were transferred to the planning computer. There, the prostate, urethra and rectum were defined on each axial reconstructed image. Then, the software created an optimized 3D plan and a few seed positions were adjusted. The quality of the plan was reviewed using [facility] information for the target and the following two organs at risk:  Urethra and Rectum.  Then the accepted plan was printed and handed off to the radiation therapist.  Under my supervision, the custom loading of the seeds and spacers was carried out and loaded into sealed vicryl sleeves.  These pre-loaded needles were then placed into the needle holder.Marland Kitchen  PROSTATE VOLUME STUDY:  Using transrectal ultrasound the volume of the prostate was verified to be 45.9 cc.  SPECIAL TREATMENT PROCEDURE/SUPERVISION AND HANDLING: The pre-loaded needles were then delivered under sagittal guidance. A  total of 21 needles were used to deposit 68 seeds in the prostate gland. The individual seed activity was 0.507 mCi.  SpaceOAR:  Yes  COMPLEX SIMULATION: At the end of the procedure, an anterior radiograph of the pelvis was obtained to document seed positioning and count. Cystoscopy was performed to check the urethra and bladder.  MICRODOSIMETRY: At the end of the procedure, the patient was emitting 0.179 mR/hr at 1 meter. Accordingly, he was considered safe for hospital discharge.  PLAN: The patient will return to the radiation oncology clinic for post implant CT dosimetry in three weeks.   ________________________________  Sheral Apley Tammi Klippel, M.D.

## 2017-09-22 ENCOUNTER — Encounter (HOSPITAL_BASED_OUTPATIENT_CLINIC_OR_DEPARTMENT_OTHER)
Admission: RE | Disposition: A | Discharge status: Home / Self Care | Payer: Self-pay | Source: Ambulatory Visit | Attending: Urology

## 2017-09-22 ENCOUNTER — Ambulatory Visit (HOSPITAL_BASED_OUTPATIENT_CLINIC_OR_DEPARTMENT_OTHER)
Admission: RE | Admit: 2017-09-22 | Discharge: 2017-09-22 | Disposition: A | Discharge status: Home / Self Care | Payer: Medicare Other | Source: Ambulatory Visit | Attending: Urology | Admitting: Urology

## 2017-09-22 ENCOUNTER — Ambulatory Visit (HOSPITAL_BASED_OUTPATIENT_CLINIC_OR_DEPARTMENT_OTHER): Payer: Medicare Other | Admitting: Anesthesiology

## 2017-09-22 ENCOUNTER — Ambulatory Visit (HOSPITAL_COMMUNITY): Payer: Medicare Other

## 2017-09-22 ENCOUNTER — Encounter (HOSPITAL_BASED_OUTPATIENT_CLINIC_OR_DEPARTMENT_OTHER): Payer: Self-pay

## 2017-09-22 DIAGNOSIS — Z79899 Other long term (current) drug therapy: Secondary | ICD-10-CM | POA: Diagnosis not present

## 2017-09-22 DIAGNOSIS — I10 Essential (primary) hypertension: Secondary | ICD-10-CM | POA: Insufficient documentation

## 2017-09-22 DIAGNOSIS — Z7951 Long term (current) use of inhaled steroids: Secondary | ICD-10-CM | POA: Diagnosis not present

## 2017-09-22 DIAGNOSIS — G4730 Sleep apnea, unspecified: Secondary | ICD-10-CM | POA: Insufficient documentation

## 2017-09-22 DIAGNOSIS — Z01818 Encounter for other preprocedural examination: Secondary | ICD-10-CM

## 2017-09-22 DIAGNOSIS — Z87891 Personal history of nicotine dependence: Secondary | ICD-10-CM | POA: Diagnosis not present

## 2017-09-22 DIAGNOSIS — F419 Anxiety disorder, unspecified: Secondary | ICD-10-CM | POA: Insufficient documentation

## 2017-09-22 DIAGNOSIS — Z7982 Long term (current) use of aspirin: Secondary | ICD-10-CM | POA: Insufficient documentation

## 2017-09-22 DIAGNOSIS — C61 Malignant neoplasm of prostate: Secondary | ICD-10-CM | POA: Diagnosis not present

## 2017-09-22 HISTORY — DX: Personal history of urinary calculi: Z87.442

## 2017-09-22 HISTORY — DX: Nausea with vomiting, unspecified: R11.2

## 2017-09-22 HISTORY — PX: CYSTOSCOPY: SHX5120

## 2017-09-22 HISTORY — PX: SPACE OAR INSTILLATION: SHX6769

## 2017-09-22 HISTORY — PX: RADIOACTIVE SEED IMPLANT: SHX5150

## 2017-09-22 HISTORY — DX: Headache, unspecified: R51.9

## 2017-09-22 HISTORY — DX: Other specified postprocedural states: Z98.890

## 2017-09-22 HISTORY — DX: Sleep apnea, unspecified: G47.30

## 2017-09-22 HISTORY — DX: Headache: R51

## 2017-09-22 HISTORY — DX: Personal history of other diseases of the respiratory system: Z87.09

## 2017-09-22 SURGERY — INSERTION, RADIATION SOURCE, PROSTATE
Anesthesia: General | Site: Rectum

## 2017-09-22 MED ORDER — LIDOCAINE 2% (20 MG/ML) 5 ML SYRINGE
INTRAMUSCULAR | Status: DC | PRN
  Administered 2017-09-22: 60 mg via INTRAVENOUS

## 2017-09-22 MED ORDER — EPHEDRINE SULFATE-NACL 50-0.9 MG/10ML-% IV SOSY
PREFILLED_SYRINGE | INTRAVENOUS | Status: DC | PRN
  Administered 2017-09-22 (×4): 10 mg via INTRAVENOUS

## 2017-09-22 MED ORDER — SODIUM CHLORIDE 0.9 % IR SOLN
Status: DC | PRN
  Administered 2017-09-22: 1000 mL

## 2017-09-22 MED ORDER — FENTANYL CITRATE (PF) 100 MCG/2ML IJ SOLN
25.0000 ug | INTRAMUSCULAR | Status: DC | PRN
  Filled 2017-09-22: qty 1

## 2017-09-22 MED ORDER — CIPROFLOXACIN IN D5W 400 MG/200ML IV SOLN
400.0000 mg | INTRAVENOUS | Status: AC
  Administered 2017-09-22: 400 mg via INTRAVENOUS
  Filled 2017-09-22: qty 200

## 2017-09-22 MED ORDER — SCOPOLAMINE 1 MG/3DAYS TD PT72
MEDICATED_PATCH | TRANSDERMAL | Status: AC
  Filled 2017-09-22: qty 1

## 2017-09-22 MED ORDER — SCOPOLAMINE 1 MG/3DAYS TD PT72
1.0000 | MEDICATED_PATCH | TRANSDERMAL | Status: DC
  Administered 2017-09-22: 1.5 mg via TRANSDERMAL
  Filled 2017-09-22: qty 1

## 2017-09-22 MED ORDER — FENTANYL CITRATE (PF) 100 MCG/2ML IJ SOLN
INTRAMUSCULAR | Status: AC
  Filled 2017-09-22: qty 2

## 2017-09-22 MED ORDER — PROMETHAZINE HCL 25 MG/ML IJ SOLN
6.2500 mg | INTRAMUSCULAR | Status: DC | PRN
  Filled 2017-09-22: qty 1

## 2017-09-22 MED ORDER — FLEET ENEMA 7-19 GM/118ML RE ENEM
1.0000 | ENEMA | Freq: Once | RECTAL | Status: DC
  Filled 2017-09-22: qty 1

## 2017-09-22 MED ORDER — DEXAMETHASONE SODIUM PHOSPHATE 10 MG/ML IJ SOLN
INTRAMUSCULAR | Status: DC | PRN
  Administered 2017-09-22: 10 mg via INTRAVENOUS

## 2017-09-22 MED ORDER — LIDOCAINE 2% (20 MG/ML) 5 ML SYRINGE
INTRAMUSCULAR | Status: AC
  Filled 2017-09-22: qty 5

## 2017-09-22 MED ORDER — IOHEXOL 300 MG/ML  SOLN
INTRAMUSCULAR | Status: DC | PRN
  Administered 2017-09-22: 7 mL

## 2017-09-22 MED ORDER — CEPHALEXIN 500 MG PO CAPS: 500.0000 mg | ORAL_CAPSULE | Freq: Three times a day (TID) | ORAL | 0 refills | Status: DC

## 2017-09-22 MED ORDER — ONDANSETRON HCL 4 MG/2ML IJ SOLN
INTRAMUSCULAR | Status: AC
  Filled 2017-09-22: qty 2

## 2017-09-22 MED ORDER — MIDAZOLAM HCL 2 MG/2ML IJ SOLN
INTRAMUSCULAR | Status: DC | PRN
  Administered 2017-09-22: 2 mg via INTRAVENOUS

## 2017-09-22 MED ORDER — SODIUM CHLORIDE 0.9 % IJ SOLN
INTRAMUSCULAR | Status: DC | PRN
  Administered 2017-09-22: 10 mL

## 2017-09-22 MED ORDER — PROPOFOL 10 MG/ML IV BOLUS
INTRAVENOUS | Status: AC
  Filled 2017-09-22: qty 20

## 2017-09-22 MED ORDER — DEXAMETHASONE SODIUM PHOSPHATE 10 MG/ML IJ SOLN
INTRAMUSCULAR | Status: AC
  Filled 2017-09-22: qty 1

## 2017-09-22 MED ORDER — PROPOFOL 10 MG/ML IV BOLUS
INTRAVENOUS | Status: DC | PRN
  Administered 2017-09-22: 180 mg via INTRAVENOUS

## 2017-09-22 MED ORDER — FENTANYL CITRATE (PF) 100 MCG/2ML IJ SOLN
INTRAMUSCULAR | Status: DC | PRN
  Administered 2017-09-22 (×2): 25 ug via INTRAVENOUS
  Administered 2017-09-22: 50 ug via INTRAVENOUS

## 2017-09-22 MED ORDER — ONDANSETRON HCL 4 MG/2ML IJ SOLN
INTRAMUSCULAR | Status: DC | PRN
  Administered 2017-09-22: 4 mg via INTRAVENOUS

## 2017-09-22 MED ORDER — EPHEDRINE SULFATE 50 MG/ML IJ SOLN
INTRAMUSCULAR | Status: AC
  Filled 2017-09-22: qty 1

## 2017-09-22 MED ORDER — CIPROFLOXACIN IN D5W 400 MG/200ML IV SOLN
INTRAVENOUS | Status: AC
  Filled 2017-09-22: qty 200

## 2017-09-22 MED ORDER — LACTATED RINGERS IV SOLN
INTRAVENOUS | Status: DC
  Administered 2017-09-22: 11:00:00 via INTRAVENOUS
  Administered 2017-09-22: 1000 mL via INTRAVENOUS
  Filled 2017-09-22: qty 1000

## 2017-09-22 MED ORDER — HYDROCODONE-ACETAMINOPHEN 5-325 MG PO TABS: 1.0000 | ORAL_TABLET | ORAL | 0 refills | Status: AC | PRN

## 2017-09-22 MED ORDER — MIDAZOLAM HCL 2 MG/2ML IJ SOLN
INTRAMUSCULAR | Status: AC
  Filled 2017-09-22: qty 2

## 2017-09-22 SURGICAL SUPPLY — 41 items
BAG URINE DRAINAGE (UROLOGICAL SUPPLIES) ×5 IMPLANT
BLADE CLIPPER SURG (BLADE) ×5 IMPLANT
CATH FOLEY 2WAY SLVR  5CC 16FR (CATHETERS) ×2
CATH FOLEY 2WAY SLVR 5CC 16FR (CATHETERS) ×3 IMPLANT
CATH ROBINSON RED A/P 16FR (CATHETERS) IMPLANT
CATH ROBINSON RED A/P 20FR (CATHETERS) ×5 IMPLANT
CLOTH BEACON ORANGE TIMEOUT ST (SAFETY) ×5 IMPLANT
CNTNR SPEC C3OZ STD GRAD LEK (MISCELLANEOUS) ×6 IMPLANT
CONT SPEC 3OZ W/LID STRL (MISCELLANEOUS) ×4
COVER BACK TABLE 60X90IN (DRAPES) ×5 IMPLANT
COVER MAYO STAND STRL (DRAPES) ×5 IMPLANT
DRSG TEGADERM 4X4.75 (GAUZE/BANDAGES/DRESSINGS) ×10 IMPLANT
DRSG TEGADERM 8X12 (GAUZE/BANDAGES/DRESSINGS) ×5 IMPLANT
GAUZE SPONGE 4X4 12PLY STRL (GAUZE/BANDAGES/DRESSINGS) ×5 IMPLANT
GLOVE BIO SURGEON STRL SZ 6.5 (GLOVE) ×8 IMPLANT
GLOVE BIO SURGEON STRL SZ7.5 (GLOVE) ×15 IMPLANT
GLOVE BIO SURGEON STRL SZ8 (GLOVE) ×5 IMPLANT
GLOVE BIO SURGEONS STRL SZ 6.5 (GLOVE) ×2
GLOVE BIOGEL PI IND STRL 6.5 (GLOVE) ×6 IMPLANT
GLOVE BIOGEL PI IND STRL 7.5 (GLOVE) ×6 IMPLANT
GLOVE BIOGEL PI IND STRL 8 (GLOVE) ×3 IMPLANT
GLOVE BIOGEL PI INDICATOR 6.5 (GLOVE) ×4
GLOVE BIOGEL PI INDICATOR 7.5 (GLOVE) ×4
GLOVE BIOGEL PI INDICATOR 8 (GLOVE) ×2
GLOVE ECLIPSE 8.0 STRL XLNG CF (GLOVE) ×20 IMPLANT
GOWN STRL REUS W/ TWL XL LVL3 (GOWN DISPOSABLE) ×3 IMPLANT
GOWN STRL REUS W/TWL XL LVL3 (GOWN DISPOSABLE) ×12 IMPLANT
HOLDER FOLEY CATH W/STRAP (MISCELLANEOUS) IMPLANT
IMPL SPACEOAR SYSTEM 10ML (MISCELLANEOUS) ×3 IMPLANT
IMPLANT SPACEOAR SYSTEM 10ML (MISCELLANEOUS) ×5
IV NS 1000ML (IV SOLUTION) ×2
IV NS 1000ML BAXH (IV SOLUTION) ×3 IMPLANT
KIT TURNOVER CYSTO (KITS) ×5 IMPLANT
MARKER SKIN DUAL TIP RULER LAB (MISCELLANEOUS) ×5 IMPLANT
PACK CYSTO (CUSTOM PROCEDURE TRAY) ×5 IMPLANT
SURGILUBE 2OZ TUBE FLIPTOP (MISCELLANEOUS) ×5 IMPLANT
SUT BONE WAX W31G (SUTURE) IMPLANT
SYRINGE 10CC LL (SYRINGE) ×10 IMPLANT
UNDERPAD 30X30 (UNDERPADS AND DIAPERS) ×10 IMPLANT
WATER STERILE IRR 500ML POUR (IV SOLUTION) ×5 IMPLANT
i-seed agx100 ×340 IMPLANT

## 2017-09-22 NOTE — Anesthesia Postprocedure Evaluation (Signed)
Anesthesia Post Note  Patient: Blake Ward  Procedure(s) Performed: RADIOACTIVE SEED IMPLANT/BRACHYTHERAPY IMPLANT (N/A Prostate) SPACE OAR INSTILLATION (N/A Rectum) CYSTOSCOPY FLEXIBLE (N/A Bladder)     Anesthesia Post Evaluation  Last Vitals:  Vitals:   09/22/17 1200 09/22/17 1228  BP: 136/76 135/80  Pulse: 65 65  Resp: 10 12  Temp:  36.5 C  SpO2: 100% 100%    Last Pain:  Vitals:   09/22/17 1228  TempSrc:   PainSc: 0-No pain                 Lesta Limbert DANIEL

## 2017-09-22 NOTE — Op Note (Signed)
Preoperative diagnosis: Adenocarcinoma of the prostate   Postoperative diagnosis: Same   Procedure: I-125 prostate seed implantation, cystoscopy, SpaceOar placement  Surgeon: Baruch Gouty, M.D.  Radiation oncologist: Tyler Pita, M.D.   Anesthesia: General  Drains: None  Complications: none  Indications: Clinically localized intermediate risk prostate cancer  Procedure: Patient was brought to operating suite and placement table in the supine position. At this time, a universal timeout protocol was performed, all team members were identified, Venodyne boots are placed, and he was administered IV Ancef in the preoperative period. He was placed in lithotomy position and prepped and draped in usual manner. Radiation oncology department placed a transrectal ultrasound probe anchoring stand/ grid and aligned with previous imaging from the volume study. Foley catheter was inserted without difficulty.  All needle passage was done with real-time transrectal ultrasound guidance in both the transverse and sagittal plains in order to achieve the desired preplanned position. A total of 21 needles were placed.  68 active seeds were implanted. SpaceOar was then placed in the anterior rectal fat after a test flush of normal saline to confirm the correct plane. There was good seperation of the prostate and rectum bilaterally after placement. The Foley catheter was removed and a flexible cystoscopy failed to show any seeds outside the prostate without evidence of trauma to the urethral, prostatic fossa, or bladder.  The bladder was drained.  A fluoroscopic image was then obtained showing excellent distrubution of the brachytherapy seeds.  Each seed was counted and counts were correct.    The patient was then repositioned in the supine position, reversed from anesthesia, and taken to the PACU in stable condition.

## 2017-09-22 NOTE — Transfer of Care (Signed)
Immediate Anesthesia Transfer of Care Note  Patient: Blake Ward  Procedure(s) Performed: Procedure(s) (LRB): RADIOACTIVE SEED IMPLANT/BRACHYTHERAPY IMPLANT (N/A) SPACE OAR INSTILLATION (N/A) CYSTOSCOPY FLEXIBLE (N/A)  Patient Location: PACU  Anesthesia Type: General  Level of Consciousness: awake, oriented, sedated and patient cooperative  Airway & Oxygen Therapy: Patient Spontanous Breathing and Patient connected to face mask oxygen  Post-op Assessment: Report given to PACU RN and Post -op Vital signs reviewed and stable  Post vital signs: Reviewed and stable  Complications: No apparent anesthesia complications  Last Vitals:  Vitals Value Taken Time  BP 147/75 09/22/2017 11:18 AM  Temp    Pulse    Resp 11 09/22/2017 11:20 AM  SpO2    Vitals shown include unvalidated device data.  Last Pain:  Vitals:   09/22/17 0753  TempSrc:   PainSc: 0-No pain      Patients Stated Pain Goal: 5 (09/22/17 0753)

## 2017-09-22 NOTE — Anesthesia Procedure Notes (Signed)
Procedure Name: LMA Insertion Date/Time: 09/22/2017 9:52 AM Performed by: Suan Halter, CRNA Pre-anesthesia Checklist: Patient identified, Emergency Drugs available, Suction available and Patient being monitored Patient Re-evaluated:Patient Re-evaluated prior to induction Oxygen Delivery Method: Circle system utilized Preoxygenation: Pre-oxygenation with 100% oxygen Induction Type: IV induction Ventilation: Mask ventilation without difficulty LMA: LMA inserted LMA Size: 5.0 Number of attempts: 1 Airway Equipment and Method: Bite block Placement Confirmation: positive ETCO2 Tube secured with: Tape Dental Injury: Teeth and Oropharynx as per pre-operative assessment

## 2017-09-22 NOTE — H&P (Addendum)
CC/HPI: Pt presents today for brachytherapy and space oar placement.  He is doing well and without complaint. Pt denies F/C, HA, CP, SOB, N/V, diarrhea/constipation, back pain, flank pain, hematuria, and dysuria.     CC: I have prostate cancer.  HPI: Blake Ward is a 67 year-old male established patient who is here evaluation for treatment of prostate cancer.  Patient was first seen with PSA of 4.1. Repeat PSA was back in normal range so biopsy was canceled (3.51 in March 2017). However, repeat PSA in Sept 2017 is 6.65 with 17% free PSA. Since his PSA fluctuated, he decided to defer biopsy and repeat his PSA instead. PSA was 6.19 with free PSA of 12% in September 2018.   He underwent a prostate biopsy in December 2018. This was positive for 1 core Gleason 3+4 = 7. This was in 10% of the core from the left lateral base. He also had 5% of 1 core from the right lateral base that was Gleason 3+3 = 6. There was one core of atypia in the right medial base.   The patient currently has good urinary control. He is unable to obtain an erection at this time. He does have a known umbilical hernia as well as an umbilical hernia repair with a midline supraumbilical incision approximately 10 cm in length.    ALLERGIES: No Allergies    MEDICATIONS: Aspirin Ec 81 mg tablet, delayed release Oral  Atenolol 25 mg tablet Oral  Cymbalta 30 mg capsule,delayed release Oral  Flonase 50 mcg/actuation spray, suspension Nasal  Hydrochlorothiazide 25 mg tablet Oral  Irbesartan 150 mg tablet  Omega 3 684 mg-1,200 mg capsule,delayed release Oral  Potassium TABS Oral  Vitamin B Complex capsule Oral  Vitamin C 500 mg tablet Oral  Vitamin D3 50,000 unit capsule Oral  Zinc 30 mg capsule Oral     GU PSH: Prostate Needle Biopsy - 04/15/2017      PSH Notes: Nose Surgery, Hernia Repair   NON-GU PSH: Hernia Repair - 2017 Surgical Pathology, Gross And Microscopic Examination For Prostate Needle - 04/15/2017    GU  PMH: Prostate Cancer - 06/23/2017, - 05/19/2017, - 04/21/2017 Elevated PSA - 04/15/2017, - 02/04/2017, - 01/29/2016, Elevated prostate specific antigen (PSA), - 2017 BPH w/o LUTS - 02/04/2017, (Stable), - 01/29/2016, BPH (benign prostatic hyperplasia), - 2017 ED due to arterial insufficiency - 02/04/2017    NON-GU PMH: Anxiety, Anxiety - 2017 Encounter for general adult medical examination without abnormal findings, Encounter for preventive health examination - 2017 Personal history of other diseases of the circulatory system, History of hypertension - 2017    FAMILY HISTORY: Kidney Stones - Runs In Family   SOCIAL HISTORY: Marital Status: Married Preferred Language: English; Ethnicity: Not Hispanic Or Latino; Race: White Current Smoking Status: Patient does not smoke anymore. Has not smoked since 01/05/1996.  Does not use smokeless tobacco. Has never drank.  Does not use drugs. Drinks 2 caffeinated drinks per day. Has not had a blood transfusion.     Notes: Occupation, Alcohol use, Married, Father deceased, Former smoker, Mother deceased, Caffeine use. Nicotine gum   REVIEW OF SYSTEMS:    GU Review Male:   Patient denies frequent urination, hard to postpone urination, burning/ pain with urination, get up at night to urinate, leakage of urine, stream starts and stops, trouble starting your stream, have to strain to urinate , erection problems, and penile pain.  Gastrointestinal (Upper):   Patient denies nausea, vomiting, and indigestion/ heartburn.  Gastrointestinal (Lower):  Patient denies diarrhea and constipation.  Constitutional:   Patient denies fever, night sweats, weight loss, and fatigue.  Skin:   Patient denies skin rash/ lesion and itching.  Eyes:   Patient denies blurred vision and double vision.  Ears/ Nose/ Throat:   Patient denies sore throat and sinus problems.  Hematologic/Lymphatic:   Patient denies swollen glands and easy bruising.  Cardiovascular:   Patient denies leg  swelling and chest pains.  Respiratory:   Patient denies cough and shortness of breath.  Endocrine:   Patient denies excessive thirst.  Musculoskeletal:   Patient denies back pain and joint pain.  Neurological:   Patient denies headaches and dizziness.  Psychologic:   Patient denies depression and anxiety.   VITAL SIGNS:      09/09/2017 01:41 PM  Weight 202 lb / 91.63 kg  Height 72 in / 182.88 cm  BP 127/77 mmHg  Pulse 67 /min  Temperature 97.3 F / 36.2 C  BMI 27.4 kg/m   MULTI-SYSTEM PHYSICAL EXAMINATION:    Constitutional: Well-nourished. No physical deformities. Normally developed. Good grooming.  Neck: Neck symmetrical, not swollen. Normal tracheal position.  Respiratory: Normal breath sounds. No labored breathing, no use of accessory muscles.   Cardiovascular: Regular rate and rhythm. No murmur, no gallop. Normal temperature, normal extremity pulses.   Lymphatic: No enlargement of neck, axillae, groin.  Skin: No paleness, no jaundice, no cyanosis. No lesion, no ulcer, no rash.  Neurologic / Psychiatric: Oriented to time, oriented to place, oriented to person. No depression, no anxiety, no agitation.  Gastrointestinal: No mass, no tenderness, no rigidity, non obese abdomen.  Eyes: Normal conjunctivae. Normal eyelids.  Ears, Nose, Mouth, and Throat: Left ear no scars, no lesions, no masses. Right ear no scars, no lesions, no masses. Nose no scars, no lesions, no masses. Normal hearing. Normal lips.  Musculoskeletal: Normal gait and station of head and neck.     PAST DATA REVIEWED:  Source Of History:  Patient  Records Review:   Previous Patient Records  Urine Test Review:   Urinalysis   09/09/17  Urinalysis  Urine Appearance Clear   Urine Color Yellow   Urine Glucose Neg   Urine Bilirubin Neg   Urine Ketones Neg   Urine Specific Gravity 1.010   Urine Blood Neg   Urine pH 6.0   Urine Protein Neg   Urine Urobilinogen 0.2   Urine Nitrites Neg   Urine Leukocyte Esterase  Neg    PROCEDURES:          Urinalysis - 81003 Dipstick Dipstick Cont'd  Color: Yellow Bilirubin: Neg  Appearance: Clear Ketones: Neg  Specific Gravity: 1.010 Blood: Neg  pH: 6.0 Protein: Neg  Glucose: Neg Urobilinogen: 0.2    Nitrites: Neg    Leukocyte Esterase: Neg    ASSESSMENT:      ICD-10 Details  1 GU:   Prostate Cancer - C61    PLAN:           Schedule Return Visit/Planned Activity: Keep Scheduled Appointment - Schedule Surgery          Document Letter(s):  Created for Patient: Clinical Summary         Notes:  Plan for brachytherapy with SpaceOar placement today.

## 2017-09-22 NOTE — Discharge Instructions (Signed)
Brachytherapy for Prostate Cancer Brachytherapy for prostate cancer is radiation treatment that is placed inside of the prostate (prostate gland). There are several types of brachytherapy:  Low-dose rate (LDR) therapy. This may involve temporary implants or permanent radioactive seed or pellet implants. The radiation does not travel far from the prostate, which means that healthy, noncancerous tissues around the prostate receive only a small dose of radiation. This helps to protect those tissues from injury. This type of treatment may be followed by a course of external beam radiation. ? Temporary low-dose implants are left in the prostate for 1-7 days. The implants are needles, applicators, or thin, plastic tubes (catheters) that contain radioactive material. You will need to stay in the hospital while the implant is in place. ? Permanent low-dose implants (seeds or pellets) are injected into the prostate, and they work for up to one year after they are inserted. They are left in place and are not removed.  High-dose rate (HDR) therapy. This is given through needles, applicators, or catheters that contain radioactive material. The tubes are removed after treatment, and no radiation is left in the prostate. This type of treatment may be followed by a course of external beam radiation.  Tell a health care provider about:  Any allergies you have.  All medicines you are taking, including vitamins, herbs, eye drops, creams, and over-the-counter medicines.  Any problems you or family members have had with anesthetic medicines.  Any surgeries you have had.  Any blood disorders you have.  Any medical conditions you have. What are the risks? Generally, this is a safe procedure. However, problems may occur, including:  Inflammation of the rectum.  Problems getting or keeping an erection (erectile dysfunction).  Trouble urinating.  Diarrhea.  Bleeding.  Loss of bowel control.  What  happens before the procedure? Staying hydrated Follow instructions from your health care provider about hydration, which may include:  Up to 2 hours before the procedure - you may continue to drink clear liquids, such as water, clear fruit juice, black coffee, and plain tea.  Eating and drinking Follow instructions from your health care provider about eating and drinking, which may include:  8 hours before the procedure - stop eating heavy meals or foods such as meat, fried foods, or fatty foods.  6 hours before the procedure - stop eating light meals or foods, such as toast or cereal.  6 hours before the procedure - stop drinking milk or drinks that contain milk.  2 hours before the procedure - stop drinking clear liquids.  Medicines  Ask your health care provider about: ? Changing or stopping your regular medicines. This is especially important if you are taking diabetes medicines or blood thinners. ? Taking medicines such as aspirin and ibuprofen. These medicines can thin your blood. Do not take these medicines before your procedure if your health care provider instructs you not to.  You may be given antibiotic medicine to help prevent infection. General instructions  Plan to have someone take you home from the hospital or clinic.  If you will be going home right after the procedure, plan to have someone with you for 24 hours.  You may have imaging tests done, including an ultrasound, CT scan, or MRI.  You may have blood tests done.  You may have a test to check the electrical signals in your heart (electrocardiogram).  You may need to take medicine to clean out your bowel (bowel prep). What happens during the procedure?  To lower your risk of infection: ? Your health care team will wash or sanitize their hands. ? Your skin will be washed with soap. ? Hair may be removed from the surgical area.  An IV will be inserted into one of your veins.  You will be given one or  more of the following: ? A medicine to help you relax (sedative). ? A medicine to numb the area (local anesthetic). ? A medicine to make you fall asleep (general anesthetic).  You may have a thin, plastic tube (catheter) inserted to drain your bladder.  If you are receiving brachytherapy with implants: ? A needle, applicator, or catheter will be inserted into the prostate. It will be inserted through a body cavity, such as the rectum, or through the tissue between the testicles and the anus (perineum). ? An X-ray, ultrasound, MRI, or CT scan will be used to guide the catheter or applicator toward the prostate. ? Radioactive seeds, wires, or ribbons will be fed through the catheter or applicator. ? If the high-dose method is used: ? The radioactive wires or ribbons will be left in for a few minutes and then removed. ? Once the treatment is finished, the catheter or applicator will be removed. ? If the low-dose method is used, the implant will stay in place for 1-7 days. ? You will remain in the hospital while the implant is in place. ? Once the treatment is finished, the radioactive material and catheter will be removed.  If you are receiving permanent, low-dose brachytherapy: ? Small, radioactive seeds or pellets will be injected into your prostate. This may be done through a catheter, needle, or applicator. ? The catheter or applicator will be removed, leaving the seeds in the prostate. The procedure may vary among health care providers and hospitals. What happens after the procedure?  Your blood pressure, heart rate, breathing rate, and blood oxygen level will be monitored until the medicines you were given have worn off.  Do not drive for 24 hours if you were given a sedative. Summary  Brachytherapy for prostate cancer is radiation treatment placed inside of the prostate (prostate gland).  There are several types of brachytherapy for prostate cancer, including low-dose temporary  treatment, low-dose permanent treatment, and high-dose temporary treatment.  Temporary low-dose implants are left in the prostate for 1-7 days.  Permanent low-dose implants are injected into the prostate and left in place. They work for up to one year after they are inserted.  Permanent high-dose therapy is given through tubes that contain radioactive material. The tubes are removed after treatment, and no radiation is left in the prostate. This information is not intended to replace advice given to you by your health care provider. Make sure you discuss any questions you have with your health care provider. Document Released: 09/30/2005 Document Revised: 05/01/2016 Document Reviewed: 05/01/2016 Elsevier Interactive Patient Education  2017 [place] Instructions   Activity:    Rest for the remainder of the day.  Do not drive or operate equipment today.  You may resume normal  activities in a few days as instructed by your physician, without risk of harmful radiation exposure to those around you, provided you follow the time and distance precautions on the Radiation Oncology Instruction Sheet.   Meals: Drink plenty of lipuids and eat light foods, such as gelatin or soup this evening .  You may return to normal meal plan tomorrow.  Return To Work: You may return  to work as instructed by your physician.  Special Instruction:   If any seeds are found, use tweezers to pick up seeds and place in a glass container of any kind and bring to your physician's office.  Call your physician if any of these symptoms occur:   Persistent or heavy bleeding  Urine stream diminishes or stops completely after catheter is removed  Fever equal to or greater than 101 degrees F  Cloudy urine with a strong foul odor  Severe pain  You may feel some burning pain and/or hesitancy when you urinate after the catheter is removed.  These symptoms may increase over the  next few weeks, but should diminish within forur to six weeks.  Applying moist heat to the lower abdomen or a hot tub bath may help relieve the pain.  If the discomfort becomes severe, please call your physician for additional medications.        Post Anesthesia Home Care Instructions  Activity: Get plenty of rest for the remainder of the day. A responsible individual must stay with you for 24 hours following the procedure.  For the next 24 hours, DO NOT: -Drive a car -Paediatric nurse -Drink alcoholic beverages -Take any medication unless instructed by your physician -Make any legal decisions or sign important papers.  Meals: Start with liquid foods such as gelatin or soup. Progress to regular foods as tolerated. Avoid greasy, spicy, heavy foods. If nausea and/or vomiting occur, drink only clear liquids until the nausea and/or vomiting subsides. Call your physician if vomiting continues.  Special Instructions/Symptoms: Your throat may feel dry or sore from the anesthesia or the breathing tube placed in your throat during surgery. If this causes discomfort, gargle with warm salt water. The discomfort should disappear within 24 hours.  If you had a scopolamine patch placed behind your ear for the management of post- operative nausea and/or vomiting:  1. The medication in the patch is effective for 72 hours, after which it should be removed.  Wrap patch in a tissue and discard in the trash. Wash hands thoroughly with soap and water. 2. You may remove the patch earlier than 72 hours if you experience unpleasant side effects which may include dry mouth, dizziness or visual disturbances. 3. Avoid touching the patch. Wash your hands with soap and water after contact with the patch.

## 2017-09-22 NOTE — Anesthesia Preprocedure Evaluation (Signed)
Anesthesia Evaluation  Patient identified by MRN, date of birth, ID band Patient awake    Reviewed: Allergy & Precautions, NPO status , Patient's Chart, lab work & pertinent test results, reviewed documented beta blocker date and time   History of Anesthesia Complications (+) PONV and history of anesthetic complications  Airway Mallampati: II  TM Distance: >3 FB Neck ROM: Full    Dental no notable dental hx. (+) Dental Advisory Given   Pulmonary sleep apnea , former smoker,    Pulmonary exam normal        Cardiovascular hypertension, Pt. on medications and Pt. on home beta blockers negative cardio ROS Normal cardiovascular exam     Neuro/Psych Anxiety negative neurological ROS     GI/Hepatic negative GI ROS, Neg liver ROS,   Endo/Other  negative endocrine ROS  Renal/GU negative Renal ROS  negative genitourinary   Musculoskeletal negative musculoskeletal ROS (+)   Abdominal   Peds negative pediatric ROS (+)  Hematology negative hematology ROS (+)   Anesthesia Other Findings   Reproductive/Obstetrics negative OB ROS                             Anesthesia Physical Anesthesia Plan  ASA: III  Anesthesia Plan: General   Post-op Pain Management:    Induction: Intravenous  PONV Risk Score and Plan: 3  Airway Management Planned: Oral ETT  Additional Equipment:   Intra-op Plan:   Post-operative Plan: Extubation in OR  Informed Consent: I have reviewed the patients History and Physical, chart, labs and discussed the procedure including the risks, benefits and alternatives for the proposed anesthesia with the patient or authorized representative who has indicated his/her understanding and acceptance.   Dental advisory given  Plan Discussed with: CRNA and Anesthesiologist  Anesthesia Plan Comments:         Anesthesia Quick Evaluation

## 2017-09-23 ENCOUNTER — Encounter (HOSPITAL_BASED_OUTPATIENT_CLINIC_OR_DEPARTMENT_OTHER): Payer: Self-pay | Admitting: Urology

## 2017-10-01 NOTE — Progress Notes (Signed)
  Radiation Oncology         [phone]) [phone] ________________________________  Name: Blake Ward MRN: [id number]  Date: 10/03/2017  DOB: 09-21-1950  COMPLEX SIMULATION NOTE  NARRATIVE:  The patient was brought to the [facility] today following prostate seed implantation approximately one month ago.  Identity was confirmed.  All relevant records and images related to the planned course of therapy were reviewed.  Then, the patient was set-up supine.  CT images were obtained.  The CT images were loaded into the planning software.  Then the prostate and rectum were contoured.  Treatment planning then occurred.  The implanted iodine 125 seeds were identified by the physics staff for projection of radiation distribution  I have requested : 3D Simulation  I have requested a DVH of the following structures: Prostate and rectum.    ________________________________  Sheral Apley Tammi Klippel, M.D.  This document serves as a record of services personally performed by Tyler Pita, MD. It was created on his behalf by Rae Lips, a trained medical scribe. The creation of this record is based on the scribe's personal observations and the provider's statements to them. This document has been checked and approved by the attending provider.

## 2017-10-01 NOTE — Progress Notes (Signed)
Radiation Oncology         [phone] ________________________________  Name: Blake Ward MRN: [id number]  Date: 10/03/2017  DOB: [birth year]  Post-Seed Follow-Up Visit Note  CC: [name], MD  Nickie Retort, MD  Diagnosis:   67 y.o. 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  Interval Since Last Radiation:  2 weeks  09/22/17:  Insertion of radioactive I-125 seeds into the prostate gland, 145 Gy definitive therapy, with SpaceOAR  Narrative:  The patient returns today for routine follow-up.  He is complaining of increased urinary frequency and urinary hesitation symptoms. He filled out a questionnaire regarding urinary function today providing an overall IPSS score of 17 characterizing his symptoms as moderate.  He reports significant daytime frequency, intermittency, weak stream and occasional straining to void but overall feels that he empties his bladder well on voiding.  He reports nocturia x2 per night.  He also reports occasional dysuria at the start of his stream first thing in the mornings or if he has held his urine for any period of time but otherwise is voiding without difficulty.  He denies gross hematuria or incontinence.  His pre-implant score was 1. He denies any bowel symptoms and reports a healthy appetite.  ALLERGIES:  has No Known Allergies.  Meds: Current Outpatient Medications  Medication Sig Dispense Refill  . ascorbic acid (VITAMIN C) 500 MG tablet Take 500 mg by mouth 2 (two) times daily.    Marland Kitchen aspirin EC 81 MG tablet Take 81 mg by mouth daily.    Marland Kitchen atenolol (TENORMIN) 25 MG tablet Take 25 mg by mouth every evening.     Marland Kitchen b complex vitamins capsule Take 1 capsule by mouth daily.    . cephALEXin (KEFLEX) 500 MG capsule Take 1 capsule (500 mg total) by mouth 3 (three) times daily. 6 capsule 0  . DULoxetine (CYMBALTA) 30 MG capsule Take 30 mg by mouth every morning.     .  fluticasone (FLONASE) 50 MCG/ACT nasal spray Place 2 sprays into both nostrils every morning.     . hydrochlorothiazide (HYDRODIURIL) 25 MG tablet Take 25 mg by mouth every morning.     Marland Kitchen HYDROcodone-acetaminophen (NORCO/VICODIN) 5-325 MG tablet Take 1 tablet by mouth every 4 (four) hours as needed for moderate pain. 20 tablet 0  . irbesartan (AVAPRO) 150 MG tablet Take 150 mg by mouth every evening.   1  . OVER THE COUNTER MEDICATION daily.    Marland Kitchen zinc gluconate 50 MG tablet Take 50 mg by mouth daily.     No current facility-administered medications for this encounter.     Physical Findings: In general this is a well appearing Caucasian male in no acute distress. He's alert and oriented x4 and appropriate throughout the examination. Cardiopulmonary assessment is negative for acute distress and he exhibits normal effort.   Lab Findings: Lab Results  Component Value Date   WBC 5.1 09/15/2017   HGB 14.0 09/15/2017   HCT 40.2 09/15/2017   MCV 91.0 09/15/2017   PLT 231 09/15/2017    Radiographic Findings:  Patient underwent CT imaging in our clinic for post implant dosimetry. The CT was reviewed by Dr. Tammi Klippel and appears to demonstrate an adequate distribution of radioactive seeds throughout the prostate gland. There are no seeds in or near the rectum. We suspect the final radiation plan and dosimetry will show appropriate coverage of the prostate gland.  Impression/Plan: The patient is recovering from the effects of radiation. His urinary symptoms should gradually improve over the next 4-6 months. We talked about this today. He is encouraged by his improvement already and is otherwise pleased with his outcome.  I offered a prescription for Flomax to help manage his lower urinary tract symptoms but at this point he feels that he is making steady progress and prefers to remain off medications unless absolutely necessary.  We also talked about long-term follow-up for prostate cancer following  seed implant. He understands that ongoing PSA determinations and digital rectal exams will help perform surveillance to rule out disease recurrence. He does not yet have a follow up appointment scheduled with Dr. Pilar Jarvis.  He will call [facility] to schedule his follow-up visit for August 2019.  He understands what to expect with his PSA measures. Patient was also educated today about some of the long-term effects from radiation including a small risk for rectal bleeding and possibly erectile dysfunction. We talked about some of the general management approaches to these potential complications. However, I did encourage the patient to contact our office or return at any point if he has questions or concerns related to his previous radiation and prostate cancer.    Nicholos Johns, PA-C  This document serves as a record of services personally performed by [employer], PA-C. It was created on her behalf by Rae Lips, a trained medical scribe. The creation of this record is based on the scribe's personal observations and the provider's statements to them. This document has been checked and approved by the attending provider.

## 2017-10-02 ENCOUNTER — Telehealth: Payer: Self-pay | Admitting: *Deleted

## 2017-10-02 NOTE — Telephone Encounter (Signed)
CALLED PATIENT TO REMIND OF POST SEED APPTS. AND MRI FOR 10-03-17, LVM FOR A RETURN CALL

## 2017-10-03 ENCOUNTER — Other Ambulatory Visit: Payer: Self-pay

## 2017-10-03 ENCOUNTER — Encounter: Payer: Self-pay | Admitting: Radiation Oncology

## 2017-10-03 ENCOUNTER — Ambulatory Visit
Admission: RE | Admit: 2017-10-03 | Discharge: 2017-10-03 | Disposition: A | Discharge status: Home / Self Care | Payer: Medicare Other | Source: Ambulatory Visit | Attending: Radiation Oncology | Admitting: Radiation Oncology

## 2017-10-03 ENCOUNTER — Encounter: Payer: Self-pay | Admitting: Medical Oncology

## 2017-10-03 ENCOUNTER — Ambulatory Visit (HOSPITAL_COMMUNITY)
Admission: RE | Admit: 2017-10-03 | Discharge: 2017-10-03 | Disposition: A | Discharge status: Home / Self Care | Payer: Medicare Other | Source: Ambulatory Visit | Attending: Urology | Admitting: Urology

## 2017-10-03 VITALS — BP 134/85 | HR 57 | Temp 98.5°F | Resp 20 | Wt 206.4 lb

## 2017-10-03 DIAGNOSIS — C61 Malignant neoplasm of prostate: Secondary | ICD-10-CM | POA: Diagnosis not present

## 2017-10-03 DIAGNOSIS — Z510 Encounter for antineoplastic radiation therapy: Secondary | ICD-10-CM | POA: Insufficient documentation

## 2017-10-03 NOTE — Progress Notes (Signed)
Blake Ward states seed implant went well. He states he had lots of energy post procedure but the past few days has noticed increase in fatigue. His urinary symptoms are slowly beginning to improve. We discussed how these should continue to improve with time. He does not have a follow up with Dr. Pilar Jarvis but he will contact office for an appointment. I asked him to call with questions or concerns. He voiced understanding.

## 2017-10-03 NOTE — Progress Notes (Signed)
Received patient in the clinic follow post seed simulation. Weight and vitals stable. Denies pain.  Pre seed IPSS 1. Post seed IPSS 17. MRI to confirm SpaceOar placement scheduled for 8:30 pm tonight. Reports occasional dysuria. Denies hematuria. Denies urinary leakage or incontinence. Denies diarrhea. No follow up appointment with [facility].   BP 134/85   Pulse (!) 57   Temp 98.5 F (36.9 C) (Oral)   Resp 20   Wt 206 lb 6.4 oz (93.6 kg)   SpO2 98%   BMI 27.99 kg/m  Wt Readings from Last 3 Encounters:  10/03/17 206 lb 6.4 oz (93.6 kg)  10/03/17 206 lb 6.4 oz (93.6 kg)  09/22/17 199 lb 3.2 oz (90.4 kg)

## 2017-10-21 ENCOUNTER — Ambulatory Visit: Payer: Medicare Other | Attending: Radiation Oncology | Admitting: Radiation Oncology

## 2017-10-21 ENCOUNTER — Encounter: Payer: Self-pay | Admitting: Radiation Oncology

## 2017-10-21 DIAGNOSIS — C61 Malignant neoplasm of prostate: Secondary | ICD-10-CM | POA: Diagnosis present

## 2017-10-21 DIAGNOSIS — Z510 Encounter for antineoplastic radiation therapy: Secondary | ICD-10-CM | POA: Insufficient documentation

## 2017-11-02 NOTE — Progress Notes (Signed)
  Radiation Oncology         [phone]) [phone] ________________________________  Name: Blake Ward MRN: [id number]  Date: 10/21/2017  DOB: [birth year]  3D Planning Note   Prostate Brachytherapy Post-Implant Dosimetry  Diagnosis: 67 y.o. 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 He underwent CT scan complex simulation to delineate the three-dimensional structures of the pelvis and demonstrate the radiation distribution.  Since that time, the seed localization, and complex isodose planning with dose volume histograms have now been completed.  Results:   Prostate Coverage - The dose of radiation delivered to the 90% or more of the prostate gland (D90) was 112.17% of the prescription dose. This exceeds our goal of greater than 90%. Rectal Sparing - The volume of rectal tissue receiving the prescription dose or higher was 0.0 cc. This falls under our thresholds tolerance of 1.0 cc.  Impression: The prostate seed implant appears to show adequate target coverage and appropriate rectal sparing.  Plan:  The patient will continue to follow with urology for ongoing PSA determinations. I would anticipate a high likelihood for local tumor control with minimal risk for rectal morbidity.  ________________________________  Sheral Apley Tammi Klippel, M.D.

## 2017-12-27 IMAGING — CT CT HEAD W/O CM
2 of 10 series · 14 of 47 positions shown, 17 images · IV contrast (iopamidol)
Comparison: None.

CLINICAL DATA: Headache with dizziness and nausea for a few days.
Cerebral hemorrhage suspected.

EXAM:
CT ANGIOGRAPHY HEAD
TECHNIQUE: Multidetector CT imaging of the head was performed using the
standard protocol during bolus administration of intravenous
contrast. Multiplanar CT image reconstructions and MIPs were
obtained to evaluate the vascular anatomy.
CONTRAST:  100mL ZTBXZZ-9ZW IOPAMIDOL (ZTBXZZ-9ZW) INJECTION 76%

[Series 13: ax thin · axial · 0.42mm/px · z∈[-242,-115]mm · 11 of 159 slices shown, 14 images]
[im 14/159  brain]
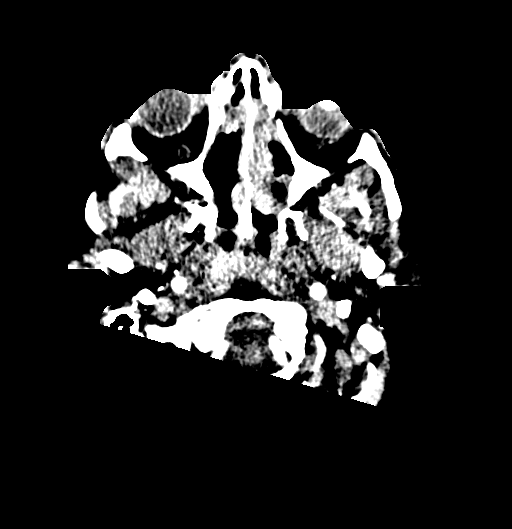
[im 14/159  bone]
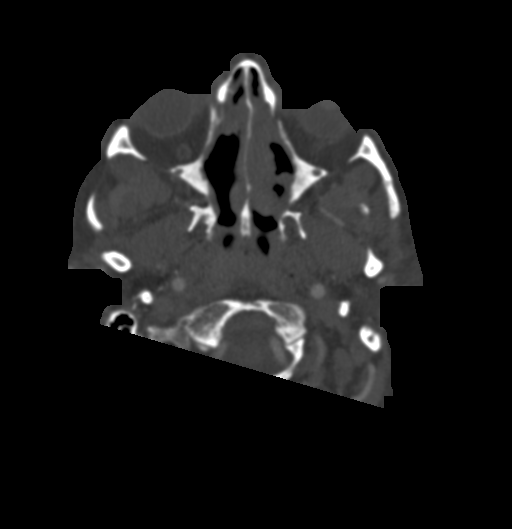
[im 27/159  brain]
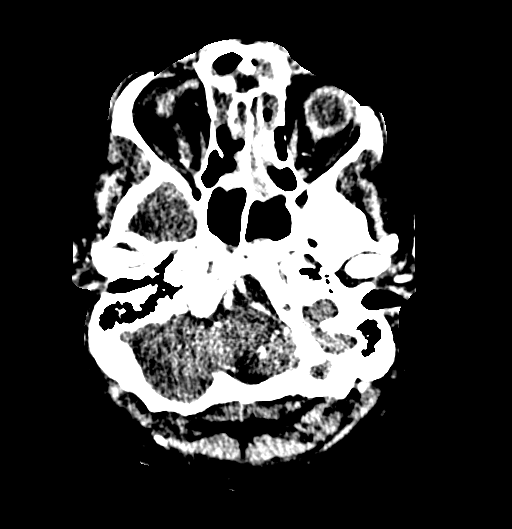
[im 40/159  brain]
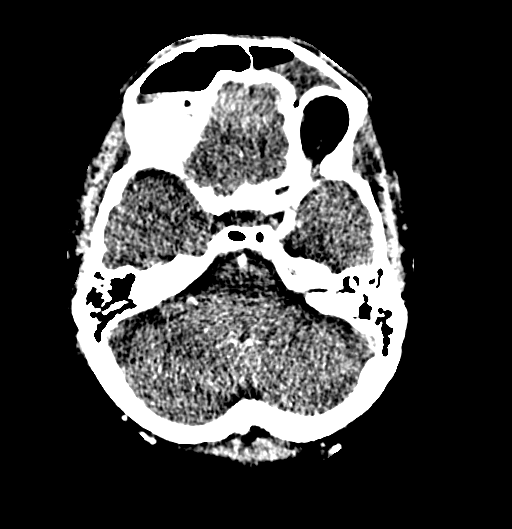
[im 53/159  brain]
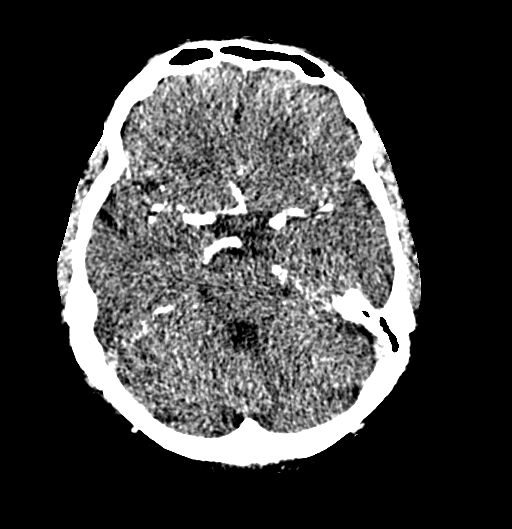
[im 66/159  brain]
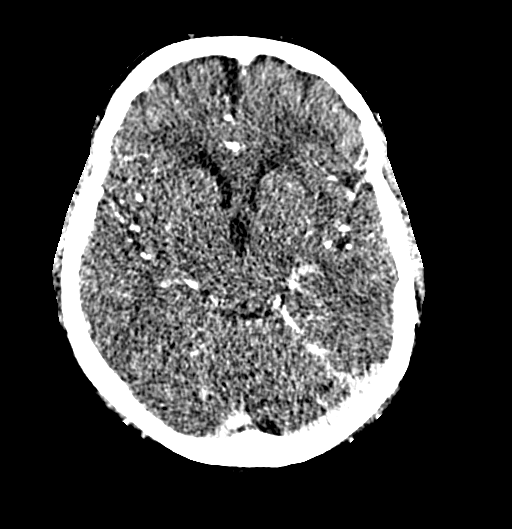
[im 66/159  bone]
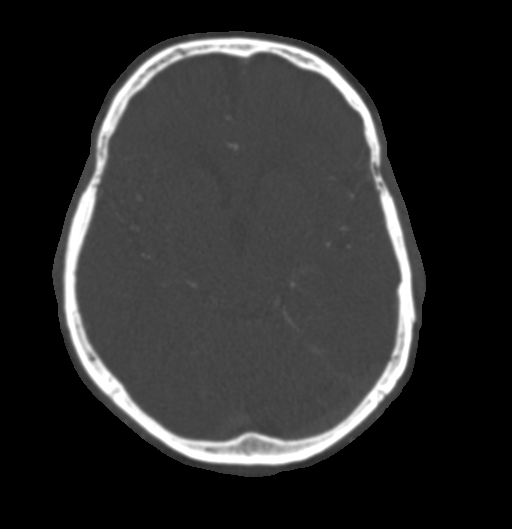
[im 80/159  brain]
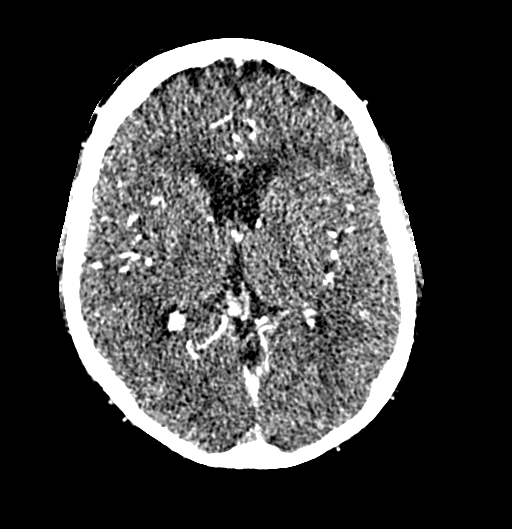
[im 93/159  brain]
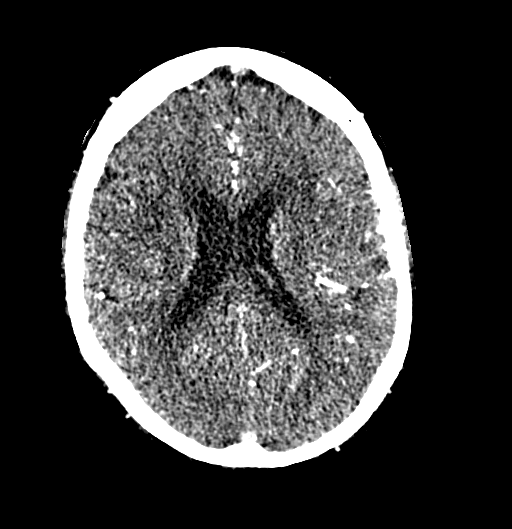
[im 106/159  brain]
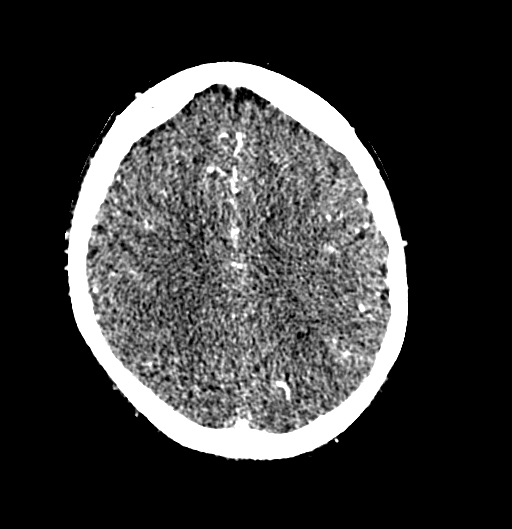
[im 119/159  brain]
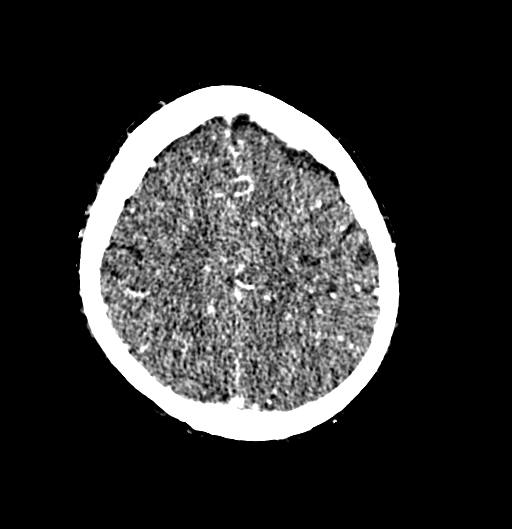
[im 119/159  bone]
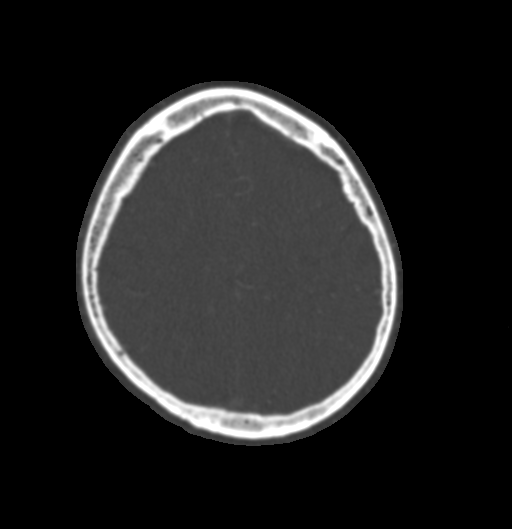
[im 132/159  brain]
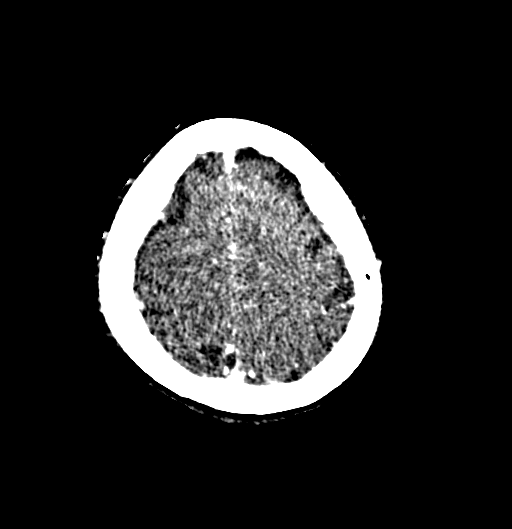
[im 145/159  brain]
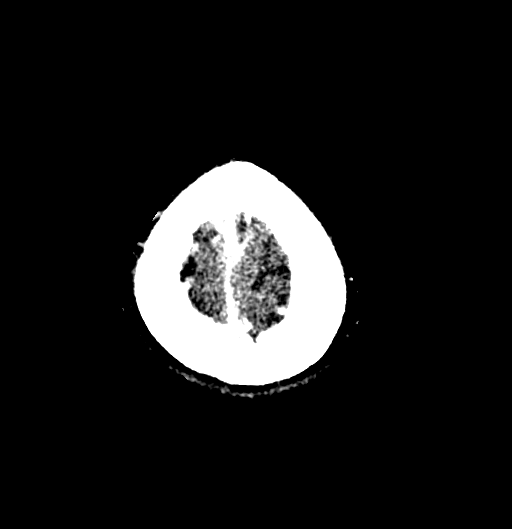

[Series 15: cor thin · coronal · 0.30mm/px · 3 of 213 slices shown]
[im 61/213  brain]
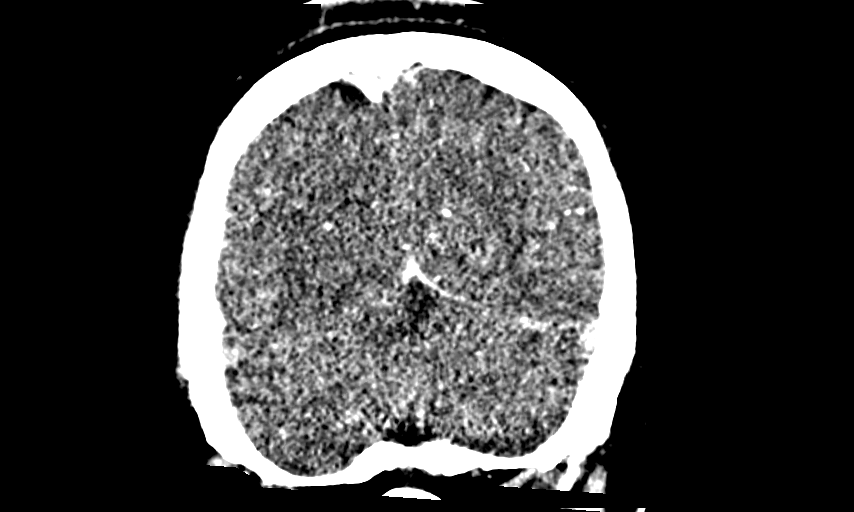
[im 91/213  brain]
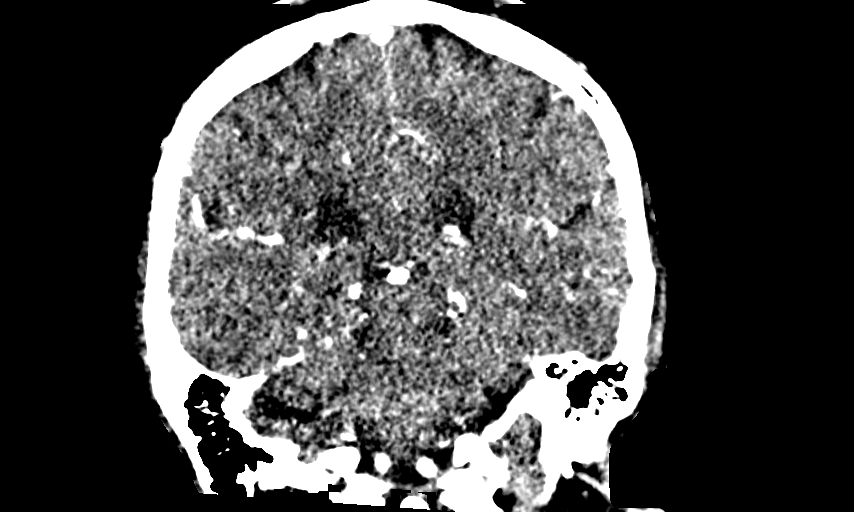
[im 122/213  brain]
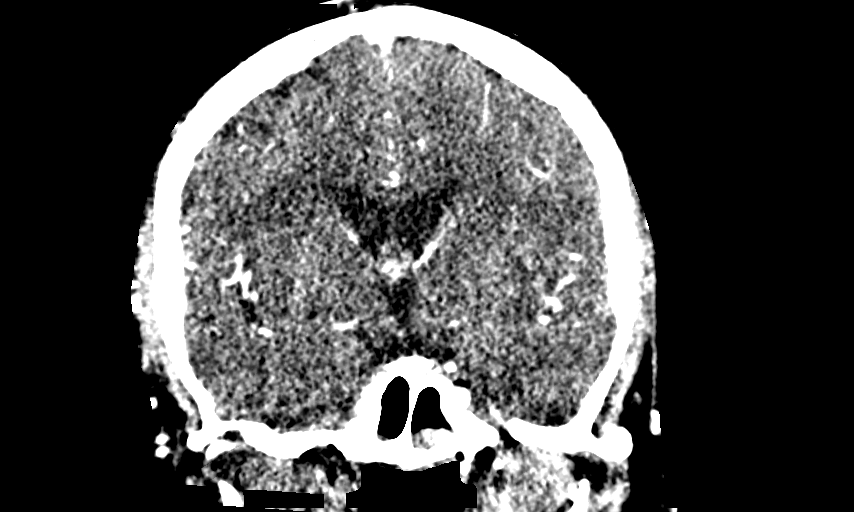

[14 of 47 positions shown; findings below may reference images not displayed]

FINDINGS: CT HEAD

Brain: No evidence of acute infarction, hemorrhage, hydrocephalus,
extra-axial collection or mass lesion/mass effect. Patchy
low-density in the cerebral white matter best attributed to chronic
small vessel ischemia in this patient with history of hypertension.

Vascular: See below

Skull: No acute or aggressive finding

Sinuses: Prior endoscopic sinus surgery. Maxillary sinuses are
atelectatic. There are polypoid masses in the bilateral nasal
cavity, left more than right, and in the frontal sinuses and ethmoid
remnants. No fluid levels.

Orbits: Negative

CTA HEAD

Anterior circulation: No branch occlusion, stenosis, or beading. No
evidence of aneurysm or vascular malformation.

Posterior circulation: Hypoplastic/aplastic left P1 segment. Vessels
are smooth and widely patent. Mild atherosclerotic plaque on the
right V4 segment.

Venous sinuses: Patent

Anatomic variants: As above

Delayed phase: No abnormal intracranial enhancement.
IMPRESSION: 1. No acute finding to explain headache.
2. White matter disease, likely chronic small vessel ischemia.
3. Mild atherosclerotic plaque.
4. Sinonasal polyposis.  Status post endoscopic sinus surgery.

## 2019-05-25 ENCOUNTER — Ambulatory Visit: Payer: Medicare Other | Attending: Internal Medicine

## 2019-05-25 DIAGNOSIS — Z23 Encounter for immunization: Secondary | ICD-10-CM | POA: Insufficient documentation

## 2019-05-25 NOTE — Progress Notes (Signed)
   Covid-19 Vaccination Clinic  Name:  Blake Ward    MRN: [id number] DOB: 02-22-1951  05/25/2019  Blake Ward was observed post Covid-19 immunization for 15 minutes without incidence. He was provided with Vaccine Information Sheet and instruction to access the V-Safe system.   Blake Ward was instructed to call 911 with any severe reactions post vaccine: Marland Kitchen Difficulty breathing  . Swelling of your face and throat  . A fast heartbeat  . A bad rash all over your body  . Dizziness and weakness    Immunizations Administered    Name Date Dose VIS Date Route   Pfizer COVID-19 Vaccine 05/25/2019  4:55 PM 0.3 mL 04/16/2019 Intramuscular   Manufacturer: [place]   Lot: [id number]   [place]: [id number]

## 2019-06-15 ENCOUNTER — Ambulatory Visit: Payer: Medicare Other | Attending: Internal Medicine

## 2019-06-15 DIAGNOSIS — Z23 Encounter for immunization: Secondary | ICD-10-CM | POA: Insufficient documentation

## 2019-06-15 NOTE — Progress Notes (Signed)
   Covid-19 Vaccination Clinic  Name:  Blake Ward    MRN: [id number] DOB: June 24, 1950  06/15/2019  [name] was observed post Covid-19 immunization for 15 minutes without incidence. He was provided with Vaccine Information Sheet and instruction to access the V-Safe system.   [name] was instructed to call 911 with any severe reactions post vaccine: Marland Kitchen Difficulty breathing  . Swelling of your face and throat  . A fast heartbeat  . A bad rash all over your body  . Dizziness and weakness    Immunizations Administered    Name Date Dose VIS Date Route   Pfizer COVID-19 Vaccine 06/15/2019  2:18 PM 0.3 mL 04/16/2019 Intramuscular   Manufacturer: [place]   Lot: [place] [id number]   [place]: [id number]

## 2019-06-18 ENCOUNTER — Encounter: Payer: Self-pay | Admitting: *Deleted

## 2019-12-01 ENCOUNTER — Other Ambulatory Visit (HOSPITAL_BASED_OUTPATIENT_CLINIC_OR_DEPARTMENT_OTHER): Payer: Self-pay | Admitting: Family Medicine

## 2019-12-01 DIAGNOSIS — R1011 Right upper quadrant pain: Secondary | ICD-10-CM

## 2019-12-06 ENCOUNTER — Other Ambulatory Visit: Payer: Self-pay

## 2019-12-06 ENCOUNTER — Ambulatory Visit (HOSPITAL_BASED_OUTPATIENT_CLINIC_OR_DEPARTMENT_OTHER)
Admission: RE | Admit: 2019-12-06 | Discharge: 2019-12-06 | Disposition: A | Discharge status: Home / Self Care | Payer: Medicare Other | Source: Ambulatory Visit | Attending: Family Medicine | Admitting: Family Medicine

## 2019-12-06 DIAGNOSIS — R1011 Right upper quadrant pain: Secondary | ICD-10-CM | POA: Insufficient documentation

## 2020-02-02 ENCOUNTER — Emergency Department (HOSPITAL_COMMUNITY)
Admission: EM | Admit: 2020-02-02 | Discharge: 2020-02-02 | Disposition: A | Discharge status: Home / Self Care | Payer: Medicare Other | Attending: Emergency Medicine | Admitting: Emergency Medicine

## 2020-02-02 ENCOUNTER — Other Ambulatory Visit: Payer: Self-pay

## 2020-02-02 ENCOUNTER — Encounter (HOSPITAL_COMMUNITY): Payer: Self-pay | Admitting: Emergency Medicine

## 2020-02-02 DIAGNOSIS — E871 Hypo-osmolality and hyponatremia: Secondary | ICD-10-CM | POA: Diagnosis not present

## 2020-02-02 DIAGNOSIS — Z87891 Personal history of nicotine dependence: Secondary | ICD-10-CM | POA: Insufficient documentation

## 2020-02-02 DIAGNOSIS — R197 Diarrhea, unspecified: Secondary | ICD-10-CM

## 2020-02-02 DIAGNOSIS — Z7982 Long term (current) use of aspirin: Secondary | ICD-10-CM | POA: Diagnosis not present

## 2020-02-02 DIAGNOSIS — I10 Essential (primary) hypertension: Secondary | ICD-10-CM | POA: Insufficient documentation

## 2020-02-02 DIAGNOSIS — Z79899 Other long term (current) drug therapy: Secondary | ICD-10-CM | POA: Diagnosis not present

## 2020-02-02 DIAGNOSIS — R110 Nausea: Secondary | ICD-10-CM

## 2020-02-02 DIAGNOSIS — K8020 Calculus of gallbladder without cholecystitis without obstruction: Secondary | ICD-10-CM

## 2020-02-02 LAB — CBC
HCT: 42.9 % (ref 39.0–52.0)
Hemoglobin: 15.4 g/dL (ref 13.0–17.0)
MCH: 32.6 pg (ref 26.0–34.0)
MCHC: 35.9 g/dL (ref 30.0–36.0)
MCV: 90.7 fL (ref 80.0–100.0)
Platelets: 192 10*3/uL (ref 150–400)
RBC: 4.73 MIL/uL (ref 4.22–5.81)
RDW: 12.4 % (ref 11.5–15.5)
WBC: 7.7 10*3/uL (ref 4.0–10.5)
nRBC: 0 % (ref 0.0–0.2)

## 2020-02-02 LAB — COMPREHENSIVE METABOLIC PANEL
ALT: 18 U/L (ref 0–44)
AST: 19 U/L (ref 15–41)
Albumin: 4.3 g/dL (ref 3.5–5.0)
Alkaline Phosphatase: 56 U/L (ref 38–126)
Anion gap: 13 (ref 5–15)
BUN: 19 mg/dL (ref 8–23)
CO2: 23 mmol/L (ref 22–32)
Calcium: 9.8 mg/dL (ref 8.9–10.3)
Chloride: 98 mmol/L (ref 98–111)
Creatinine, Ser: 1.05 mg/dL (ref 0.61–1.24)
GFR calc Af Amer: >60 mL/min (ref >60)
GFR calc non Af Amer: >60 mL/min (ref >60)
Glucose, Bld: 115 mg/dL — ABNORMAL HIGH (ref 70–99)
Potassium: 3.6 mmol/L (ref 3.5–5.1)
Sodium: 134 mmol/L — ABNORMAL LOW (ref 135–145)
Total Bilirubin: 1 mg/dL (ref 0.3–1.2)
Total Protein: 7.4 g/dL (ref 6.5–8.1)

## 2020-02-02 LAB — LIPASE, BLOOD: Lipase: 37 U/L (ref 11–51)

## 2020-02-02 MED ORDER — LACTATED RINGERS IV BOLUS
1000.0000 mL | Freq: Once | INTRAVENOUS | Status: AC
  Administered 2020-02-02: 1000 mL via INTRAVENOUS

## 2020-02-02 NOTE — ED Notes (Signed)
Patient ambulatory to the room with c/o nausea and diarrhea that started last night.  Patient states that he has been having gallbladder issues since June.

## 2020-02-02 NOTE — ED Triage Notes (Signed)
Patient is complaining of abdominal pain and nausea. Patient thinks it is a gallbladder attack.

## 2020-02-02 NOTE — ED Notes (Signed)
ED Provider at bedside. 

## 2020-02-02 NOTE — ED Notes (Signed)
Upon going into DC pt. Pts IV had been pulled out and was lying on the floor. Pt did not notify staff he was leaving. Pt left prior to DC papers being reviewed.

## 2020-02-02 NOTE — Discharge Instructions (Addendum)
Take loperamide (Imodium AD) as needed for diarrhea.  Keep your appointment with Dr. Kae Heller.  Return if you are having any problems.

## 2020-02-02 NOTE — ED Notes (Signed)
Pt left prior to vitals being obtained

## 2020-02-02 NOTE — ED Provider Notes (Signed)
[facility] DEPT Provider Note   CSN: [id number] Arrival date & time: 02/02/20  0324   History Chief Complaint  Patient presents with  . Abdominal Pain    Blake Ward is a 69 y.o. male.  The history is provided by the patient.  Abdominal Pain He has history of hypertension, gallstones and thinks he had a gallbladder attack tonight.  He had a bowl of Cheerios with milk, and shortly after that, developed nausea and diarrhea.  He had a large amount of diarrhea on about seven occasions.  There is no vomiting.  He denies abdominal pain.  Denies fever or chills.  Symptoms have abated and he currently no longer has nausea and does not feel like he is going to have more diarrhea.  He denies any sick contacts.  He is scheduled to see a surgeon in 2 weeks.  Past Medical History:  Diagnosis Date  . Anxiety   . Environmental allergies   . Headache   . History of kidney stones   . History of nasal polyp   . Hypertension   . PONV (postoperative nausea and vomiting)   . Prostate cancer ([place])   . Sleep apnea    self diagnosis, has to sleep on side because he feels his jaw obstructs his airway when he falls asleep on his back    Patient Active Problem List   Diagnosis Date Noted  . Malignant neoplasm of prostate ([place]) 05/08/2017    Past Surgical History:  Procedure Laterality Date  . COLONOSCOPY    . CYSTOSCOPY N/A 09/22/2017   Procedure: CYSTOSCOPY FLEXIBLE;  Surgeon: Nickie Retort, MD;  Location: [facility];  Service: Urology;  Laterality: N/A;  no seeds found in bladder  . [place], 2019   Polypectomy  . PROSTATE BIOPSY    . RADIOACTIVE SEED IMPLANT N/A 09/22/2017   Procedure: RADIOACTIVE SEED IMPLANT/BRACHYTHERAPY IMPLANT;  Surgeon: Nickie Retort, MD;  Location: [facility];  Service: Urology;  Laterality: N/A;    67   seeds implanted  . SPACE  OAR INSTILLATION N/A 09/22/2017   Procedure: SPACE OAR INSTILLATION;  Surgeon: Nickie Retort, MD;  Location: [facility];  Service: Urology;  Laterality: N/A;  . UPPER GI ENDOSCOPY         Family History  Problem Relation Age of Onset  . Cancer Brother        colon cancer    Social History   Tobacco Use  . Smoking status: Former Smoker    Packs/day: 1.00    Years: 15.00    Pack years: 15.00    Types: Cigarettes    Quit date: 01/05/1996    Years since quitting: 24.0  . Smokeless tobacco: Never Used  . Tobacco comment: Currently chews nicotine gum  Vaping Use  . Vaping Use: Never used  Substance Use Topics  . Alcohol use: No  . Drug use: No    Home Medications Prior to Admission medications   Medication Sig Start Date End Date Taking? Authorizing Provider  ascorbic acid (VITAMIN C) 500 MG tablet Take 500 mg by mouth 2 (two) times daily.    [provider]  aspirin EC 81 MG tablet Take 81 mg by mouth daily.    [provider]  atenolol (TENORMIN) 25 MG tablet Take 25 mg by mouth every evening.  04/14/17  [provider]  b complex vitamins capsule Take 1 capsule by mouth daily.    [provider]  cephALEXin (KEFLEX) 500 MG capsule Take 1 capsule (500 mg total) by mouth 3 (three) times daily. 09/22/17   Nickie Retort, MD  DULoxetine (CYMBALTA) 30 MG capsule Take 30 mg by mouth every morning.  04/14/17   [provider]  fluticasone (FLONASE) 50 MCG/ACT nasal spray Place 2 sprays into both nostrils every morning.  04/14/17   [provider]  hydrochlorothiazide (HYDRODIURIL) 25 MG tablet Take 25 mg by mouth every morning.  04/14/17   [provider]  irbesartan (AVAPRO) 150 MG tablet Take 150 mg by mouth every evening.  04/30/17   [provider]  moxifloxacin (VIGAMOX) 0.5 % ophthalmic solution 1 drop in the left eye 4 times a day. Begin 1 day prior to surgery and continue for 1  week after surgery 10/02/17   [provider]  OVER THE COUNTER MEDICATION daily.    [provider]  zinc gluconate 50 MG tablet Take 50 mg by mouth daily.    [provider]    Allergies    Patient has no known allergies.  Review of Systems   Review of Systems  Gastrointestinal: Positive for abdominal pain.  All other systems reviewed and are negative.   Physical Exam Updated Vital Signs BP 118/81 (BP Location: Right Arm)   Pulse 93   Temp 98.4 F (36.9 C) (Oral)   Resp 16   Ht 6' (1.829 m)   Wt 88 kg   SpO2 94%   BMI 26.31 kg/m   Physical Exam Vitals and nursing note reviewed.   69 year old male, resting comfortably and in no acute distress. Vital signs are normal. Oxygen saturation is 94%, which is normal. Head is normocephalic and atraumatic. PERRLA, EOMI. Oropharynx is clear. Neck is nontender and supple without adenopathy or JVD. Back is nontender and there is no CVA tenderness. Lungs are clear without rales, wheezes, or rhonchi. Chest is nontender. Heart has regular rate and rhythm without murmur. Abdomen is soft, flat, nontender without masses or hepatosplenomegaly and peristalsis is normoactive. Extremities have no cyanosis or edema, full range of motion is present. Skin is warm and dry without rash. Neurologic: Mental status is normal, cranial nerves are intact, there are no motor or sensory deficits.  ED Results / Procedures / Treatments   Labs (all labs ordered are listed, but only abnormal results are displayed) Labs Reviewed  COMPREHENSIVE METABOLIC PANEL - Abnormal; Notable for the following components:      Result Value   Sodium 134 (*)    Glucose, Bld 115 (*)    All other components within normal limits  LIPASE, BLOOD  CBC  URINALYSIS, ROUTINE W REFLEX MICROSCOPIC   Procedures Procedures   Medications Ordered in ED Medications  lactated ringers bolus 1,000 mL (1,000 mLs Intravenous New Bag/Given 02/02/20 1093)     ED Course  I have reviewed the triage vital signs and the nursing notes.  Pertinent lab results that were available during my care of the patient were reviewed by me and considered in my medical decision making (see chart for details).  MDM Rules/Calculators/A&P Nausea and diarrhea which have resolved.  History of cholelithiasis.  Old records are reviewed, confirming recent ultrasound showing cholelithiasis and possible gallbladder polyp.  Symptoms tonight are not typical of biliary colic and suggest possible food poisoning versus viral gastroenteritis.  Labs are significant only for borderline  hyponatremia.  We will give IV fluids, no further work-up indicated today.  He is to keep his appointment with the surgeon.  Final Clinical Impression(s) / ED Diagnoses Final diagnoses:  Diarrhea, unspecified type  Nausea  Hyponatremia  Cholelithiasis without cholecystitis    Rx / DC Orders ED Discharge Orders    None       Delora Fuel, MD 62/03/55 [phone]

## 2020-02-16 ENCOUNTER — Ambulatory Visit: Payer: Self-pay | Admitting: Surgery

## 2020-02-16 NOTE — H&P (Signed)
Surgical Evaluation  HPI: Very pleasant 69 year old man with several month history of intermittent biliary colic.  He has had attacks of epigastric and right upper quadrant pain, some associated with nausea.  He typically last 4- 5 hours. Had an ultrasound demonstrating gallstones as well as a 9 mm polyp. Labs were within normal limits.  At about of severe nausea and diarrhea which brought him to the emergency room recently, was thought this was either viral, food poisoning or perhaps related to his symptomatic gallstones. He has also noted recently a intermittently painful umbilical hernia. His only prior abdominal surgery was a epigastric hernia repair in 1969.  He is fairly active and aside from antihypertensives is on no medications.  His symptoms have been well controlled lately with a low-fat and low sugar diet.  No Known Allergies  Past Medical History:  Diagnosis Date  . Anxiety   . Environmental allergies   . Headache   . History of kidney stones   . History of nasal polyp   . Hypertension   . PONV (postoperative nausea and vomiting)   . Prostate cancer ([place])   . Sleep apnea    self diagnosis, has to sleep on side because he feels his jaw obstructs his airway when he falls asleep on his back    Past Surgical History:  Procedure Laterality Date  . COLONOSCOPY    . CYSTOSCOPY N/A 09/22/2017   Procedure: CYSTOSCOPY FLEXIBLE;  Surgeon: Nickie Retort, MD;  Location: [facility];  Service: Urology;  Laterality: N/A;  no seeds found in bladder  . [place], 2019   Polypectomy  . PROSTATE BIOPSY    . RADIOACTIVE SEED IMPLANT N/A 09/22/2017   Procedure: RADIOACTIVE SEED IMPLANT/BRACHYTHERAPY IMPLANT;  Surgeon: Nickie Retort, MD;  Location: [facility];  Service: Urology;  Laterality: N/A;    67   seeds implanted  . SPACE OAR INSTILLATION N/A 09/22/2017   Procedure: SPACE OAR  INSTILLATION;  Surgeon: Nickie Retort, MD;  Location: [facility];  Service: Urology;  Laterality: N/A;  . UPPER GI ENDOSCOPY      Family History  Problem Relation Age of Onset  . Cancer Brother        colon cancer    Social History   Socioeconomic History  . Marital status: Married    Spouse name: Not on file  . Number of children: 1  . Years of education: Not on file  . Highest education level: Not on file  Occupational History  . Occupation: retired    Comment: Corporate treasurer  . Occupation: retired    Comment: Automotive engineer business  Tobacco Use  . Smoking status: Former Smoker    Packs/day: 1.00    Years: 15.00    Pack years: 15.00    Types: Cigarettes    Quit date: 01/05/1996    Years since quitting: 24.1  . Smokeless tobacco: Never Used  . Tobacco comment: Currently chews nicotine gum  Vaping Use  . Vaping Use: Never used  Substance and Sexual Activity  . Alcohol use: No  . Drug use: No  . Sexual activity: Not Currently  Other Topics Concern  . Not on file  Social History Narrative   Patient is married. Patient has one daughter who lives in [place].   Patient lives in [place] near [place].   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    Social Determinants of Health   Financial Resource Strain:   . Difficulty of Paying Living Expenses: Not on file  Food Insecurity:   . Worried About Charity fundraiser in the Last Year: Not on file  . Ran Out of Food in the Last Year: Not on file  Transportation Needs:   . Lack of Transportation (Medical): Not on file  . Lack of Transportation (Non-Medical): Not on file  Physical Activity:   . Days of Exercise per Week: Not on file  . Minutes of Exercise per Session: Not on file  Stress:   . Feeling of Stress : Not on file  Social Connections:   . Frequency of Communication with Friends and Family: Not on file  . Frequency of Social Gatherings with Friends and  Family: Not on file  . Attends Religious Services: Not on file  . Active Member of Clubs or Organizations: Not on file  . Attends Archivist Meetings: Not on file  . Marital Status: Not on file    Current Outpatient Medications on File Prior to Visit  Medication Sig Dispense Refill  . ascorbic acid (VITAMIN C) 500 MG tablet Take 500 mg by mouth 2 (two) times daily.    Marland Kitchen aspirin EC 81 MG tablet Take 81 mg by mouth daily.    Marland Kitchen atenolol (TENORMIN) 25 MG tablet Take 25 mg by mouth every evening.     Marland Kitchen b complex vitamins capsule Take 1 capsule by mouth daily.    . cephALEXin (KEFLEX) 500 MG capsule Take 1 capsule (500 mg total) by mouth 3 (three) times daily. 6 capsule 0  . DULoxetine (CYMBALTA) 30 MG capsule Take 30 mg by mouth every morning.     . fluticasone (FLONASE) 50 MCG/ACT nasal spray Place 2 sprays into both nostrils every morning.     . hydrochlorothiazide (HYDRODIURIL) 25 MG tablet Take 25 mg by mouth every morning.     . irbesartan (AVAPRO) 150 MG tablet Take 150 mg by mouth every evening.   1  . moxifloxacin (VIGAMOX) 0.5 % ophthalmic solution 1 drop in the left eye 4 times a day. Begin 1 day prior to surgery and continue for 1 week after surgery    . OVER THE COUNTER MEDICATION daily.    Marland Kitchen zinc gluconate 50 MG tablet Take 50 mg by mouth daily.     No current facility-administered medications on file prior to visit.    Review of Systems: a complete, 10pt review of systems was completed with pertinent positives and negatives as documented in the HPI  Physical Exam: Vitals Weight: 193.13 lb Height: 72in Body Surface Area: 2.1 m Body Mass Index: 26.19 kg/m  Temp.: 98.18F  Pulse: 76 (Regular)  BP: 130/72(Sitting, Left Arm, Standard)  Alert and well-appearing. Unlabored respirations. Abdomen is soft, nontender, nondistended. Well-healed vertical midline upper abdominal scar. There is a partially reducible tender umbilical hernia, fascial defect is not  palpable.   CBC Latest Ref Rng & Units 02/02/2020 09/15/2017 04/29/2017  WBC 4.0 - 10.5 K/uL 7.7 5.1 9.5  Hemoglobin 13.0 - 17.0 g/dL 15.4 14.0 14.6  Hematocrit 39 - 52 % 42.9 40.2 41.2  Platelets 150 - 400 K/uL 192 231 213    CMP Latest Ref Rng & Units 02/02/2020 09/15/2017 04/29/2017  Glucose 70 - 99 mg/dL 115(H) 108(H) 152(H)  BUN 8 - 23 mg/dL 19 9 9   Creatinine 0.61 - 1.24 mg/dL 1.05 0.85 0.67  Sodium 135 - 145 mmol/L 134(L) 137 134(L)  Potassium 3.5 - 5.1 mmol/L 3.6 4.0 3.2(L)  Chloride 98 - 111 mmol/L 98 100(L) 98(L)  CO2 22 - 32 mmol/L 23 28 26   Calcium 8.9 - 10.3 mg/dL 9.8 9.5 9.2  Total Protein 6.5 - 8.1 g/dL 7.4 7.0 -  Total Bilirubin 0.3 - 1.2 mg/dL 1.0 0.8 -  Alkaline Phos 38 - 126 U/L 56 59 -  AST 15 - 41 U/L 19 24 -  ALT 0 - 44 U/L 18 18 -    Lab Results  Component Value Date   INR 0.97 09/15/2017    Imaging: No results found.   A/P: BILIARY COLIC (E32.00) Story: I recommend proceeding with laparoscopic cholecystectomy with possible cholangiogram. Discussed risks of surgery including bleeding, pain, scarring, intraabdominal injury specifically to the common bile duct and sequelae, bile leak, conversion to open surgery, failure to resolve symptoms, blood clots/ pulmonary embolus, heart attack, pneumonia, stroke, death. Questions welcomed and answered to patient's satisfaction.   INCARCERATED UMBILICAL HERNIA (V79.4) Story: Discussed that we will use this as one of the trocar sites and can perform a primary repair at the time of gallbladder surgery. Advised that the risk of recurrence is very high in this scenario.    Patient Active Problem List   Diagnosis Date Noted  . Malignant neoplasm of prostate ([place]) 05/08/2017       Romana Juniper, MD [facility] Surgery, [place]  See AMION to contact appropriate on-call provider

## 2020-02-16 NOTE — H&P (View-Only) (Signed)
Surgical Evaluation  HPI: Very pleasant 69 year old man with several month history of intermittent biliary colic.  He has had attacks of epigastric and right upper quadrant pain, some associated with nausea.  He typically last 4- 5 hours. Had an ultrasound demonstrating gallstones as well as a 9 mm polyp. Labs were within normal limits.  At about of severe nausea and diarrhea which brought him to the emergency room recently, was thought this was either viral, food poisoning or perhaps related to his symptomatic gallstones. He has also noted recently a intermittently painful umbilical hernia. His only prior abdominal surgery was a epigastric hernia repair in 1969.  He is fairly active and aside from antihypertensives is on no medications.  His symptoms have been well controlled lately with a low-fat and low sugar diet.  No Known Allergies  Past Medical History:  Diagnosis Date  . Anxiety   . Environmental allergies   . Headache   . History of kidney stones   . History of nasal polyp   . Hypertension   . PONV (postoperative nausea and vomiting)   . Prostate cancer ([place])   . Sleep apnea    self diagnosis, has to sleep on side because he feels his jaw obstructs his airway when he falls asleep on his back    Past Surgical History:  Procedure Laterality Date  . COLONOSCOPY    . CYSTOSCOPY N/A 09/22/2017   Procedure: CYSTOSCOPY FLEXIBLE;  Surgeon: Nickie Retort, MD;  Location: [facility];  Service: Urology;  Laterality: N/A;  no seeds found in bladder  . [place], 2019   Polypectomy  . PROSTATE BIOPSY    . RADIOACTIVE SEED IMPLANT N/A 09/22/2017   Procedure: RADIOACTIVE SEED IMPLANT/BRACHYTHERAPY IMPLANT;  Surgeon: Nickie Retort, MD;  Location: [facility];  Service: Urology;  Laterality: N/A;    67   seeds implanted  . SPACE OAR INSTILLATION N/A 09/22/2017   Procedure: SPACE OAR  INSTILLATION;  Surgeon: Nickie Retort, MD;  [facility]: [facility];  Service: Urology;  Laterality: N/A;  . UPPER GI ENDOSCOPY      Family History  Problem Relation Age of Onset  . Cancer Brother        colon cancer    Social History   Socioeconomic History  . Marital status: Married    Spouse name: Not on file  . Number of children: 1  . Years of education: Not on file  . Highest education level: Not on file  Occupational History  . Occupation: retired    Comment: Corporate treasurer  . Occupation: retired    Comment: Automotive engineer business  Tobacco Use  . Smoking status: Former Smoker    Packs/day: 1.00    Years: 15.00    Pack years: 15.00    Types: Cigarettes    Quit date: 01/05/1996    Years since quitting: 24.1  . Smokeless tobacco: Never Used  . Tobacco comment: Currently chews nicotine gum  Vaping Use  . Vaping Use: Never used  Substance and Sexual Activity  . Alcohol use: No  . Drug use: No  . Sexual activity: Not Currently  Other Topics Concern  . Not on file  Social History Narrative   Patient is married. Patient has one daughter who lives in [place].   Patient lives in [place].   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    Social Determinants of Health   Financial Resource Strain:   . Difficulty of Paying Living Expenses: Not on file  Food Insecurity:   . Worried About Charity fundraiser in the Last Year: Not on file  . Ran Out of Food in the Last Year: Not on file  Transportation Needs:   . Lack of Transportation (Medical): Not on file  . Lack of Transportation (Non-Medical): Not on file  Physical Activity:   . Days of Exercise per Week: Not on file  . Minutes of Exercise per Session: Not on file  Stress:   . Feeling of Stress : Not on file  Social Connections:   . Frequency of Communication with Friends and Family: Not on file  . Frequency of Social Gatherings with Friends and  Family: Not on file  . Attends Religious Services: Not on file  . Active Member of Clubs or Organizations: Not on file  . Attends Archivist Meetings: Not on file  . Marital Status: Not on file    Current Outpatient Medications on File Prior to Visit  Medication Sig Dispense Refill  . ascorbic acid (VITAMIN C) 500 MG tablet Take 500 mg by mouth 2 (two) times daily.    Marland Kitchen aspirin EC 81 MG tablet Take 81 mg by mouth daily.    Marland Kitchen atenolol (TENORMIN) 25 MG tablet Take 25 mg by mouth every evening.     Marland Kitchen b complex vitamins capsule Take 1 capsule by mouth daily.    . cephALEXin (KEFLEX) 500 MG capsule Take 1 capsule (500 mg total) by mouth 3 (three) times daily. 6 capsule 0  . DULoxetine (CYMBALTA) 30 MG capsule Take 30 mg by mouth every morning.     . fluticasone (FLONASE) 50 MCG/ACT nasal spray Place 2 sprays into both nostrils every morning.     . hydrochlorothiazide (HYDRODIURIL) 25 MG tablet Take 25 mg by mouth every morning.     . irbesartan (AVAPRO) 150 MG tablet Take 150 mg by mouth every evening.   1  . moxifloxacin (VIGAMOX) 0.5 % ophthalmic solution 1 drop in the left eye 4 times a day. Begin 1 day prior to surgery and continue for 1 week after surgery    . OVER THE COUNTER MEDICATION daily.    Marland Kitchen zinc gluconate 50 MG tablet Take 50 mg by mouth daily.     No current facility-administered medications on file prior to visit.    Review of Systems: a complete, 10pt review of systems was completed with pertinent positives and negatives as documented in the HPI  Physical Exam: Vitals Weight: 193.13 lb Height: 72in Body Surface Area: 2.1 m Body Mass Index: 26.19 kg/m  Temp.: 98.11F  Pulse: 76 (Regular)  BP: 130/72(Sitting, Left Arm, Standard)  Alert and well-appearing. Unlabored respirations. Abdomen is soft, nontender, nondistended. Well-healed vertical midline upper abdominal scar. There is a partially reducible tender umbilical hernia, fascial defect is not  palpable.   CBC Latest Ref Rng & Units 02/02/2020 09/15/2017 04/29/2017  WBC 4.0 - 10.5 K/uL 7.7 5.1 9.5  Hemoglobin 13.0 - 17.0 g/dL 15.4 14.0 14.6  Hematocrit 39 - 52 % 42.9 40.2 41.2  Platelets 150 - 400 K/uL 192 231 213    CMP Latest Ref Rng & Units 02/02/2020 09/15/2017 04/29/2017  Glucose 70 - 99 mg/dL 115(H) 108(H) 152(H)  BUN 8 - 23 mg/dL 19 9 9   Creatinine 0.61 - 1.24 mg/dL 1.05 0.85 0.67  Sodium 135 - 145 mmol/L 134(L) 137 134(L)  Potassium 3.5 - 5.1 mmol/L 3.6 4.0 3.2(L)  Chloride 98 - 111 mmol/L 98 100(L) 98(L)  CO2 22 - 32 mmol/L 23 28 26   Calcium 8.9 - 10.3 mg/dL 9.8 9.5 9.2  Total Protein 6.5 - 8.1 g/dL 7.4 7.0 -  Total Bilirubin 0.3 - 1.2 mg/dL 1.0 0.8 -  Alkaline Phos 38 - 126 U/L 56 59 -  AST 15 - 41 U/L 19 24 -  ALT 0 - 44 U/L 18 18 -    Lab Results  Component Value Date   INR 0.97 09/15/2017    Imaging: No results found.   A/P: BILIARY COLIC (X91.47) Story: I recommend proceeding with laparoscopic cholecystectomy with possible cholangiogram. Discussed risks of surgery including bleeding, pain, scarring, intraabdominal injury specifically to the common bile duct and sequelae, bile leak, conversion to open surgery, failure to resolve symptoms, blood clots/ pulmonary embolus, heart attack, pneumonia, stroke, death. Questions welcomed and answered to patient's satisfaction.   INCARCERATED UMBILICAL HERNIA (W29.5) Story: Discussed that we will use this as one of the trocar sites and can perform a primary repair at the time of gallbladder surgery. Advised that the risk of recurrence is very high in this scenario.    Patient Active Problem List   Diagnosis Date Noted  . Malignant neoplasm of prostate ([place]) 05/08/2017       Romana Juniper, MD [facility] Surgery, [place]  See AMION to contact appropriate on-call provider

## 2020-02-18 ENCOUNTER — Other Ambulatory Visit: Payer: Self-pay

## 2020-02-18 ENCOUNTER — Encounter (HOSPITAL_COMMUNITY)
Admission: RE | Admit: 2020-02-18 | Discharge: 2020-02-18 | Disposition: A | Discharge status: Home / Self Care | Payer: Medicare Other | Source: Ambulatory Visit | Attending: Surgery | Admitting: Surgery

## 2020-02-18 ENCOUNTER — Other Ambulatory Visit (HOSPITAL_COMMUNITY)
Admission: RE | Admit: 2020-02-18 | Discharge: 2020-02-18 | Disposition: A | Discharge status: Home / Self Care | Payer: Medicare Other | Source: Ambulatory Visit | Attending: Surgery | Admitting: Surgery

## 2020-02-18 ENCOUNTER — Encounter (HOSPITAL_COMMUNITY): Payer: Self-pay

## 2020-02-18 DIAGNOSIS — Z20822 Contact with and (suspected) exposure to covid-19: Secondary | ICD-10-CM | POA: Insufficient documentation

## 2020-02-18 DIAGNOSIS — I10 Essential (primary) hypertension: Secondary | ICD-10-CM | POA: Diagnosis not present

## 2020-02-18 DIAGNOSIS — Z01812 Encounter for preprocedural laboratory examination: Secondary | ICD-10-CM | POA: Diagnosis not present

## 2020-02-18 DIAGNOSIS — Z01810 Encounter for preprocedural cardiovascular examination: Secondary | ICD-10-CM | POA: Diagnosis present

## 2020-02-18 LAB — BASIC METABOLIC PANEL
Anion gap: 7 (ref 5–15)
BUN: 11 mg/dL (ref 8–23)
CO2: 29 mmol/L (ref 22–32)
Calcium: 9.3 mg/dL (ref 8.9–10.3)
Chloride: 97 mmol/L — ABNORMAL LOW (ref 98–111)
Creatinine, Ser: 0.92 mg/dL (ref 0.61–1.24)
GFR, Estimated: >60 mL/min (ref >60)
Glucose, Bld: 103 mg/dL — ABNORMAL HIGH (ref 70–99)
Potassium: 4 mmol/L (ref 3.5–5.1)
Sodium: 133 mmol/L — ABNORMAL LOW (ref 135–145)

## 2020-02-18 LAB — CBC
HCT: 40.5 % (ref 39.0–52.0)
Hemoglobin: 14.1 g/dL (ref 13.0–17.0)
MCH: 32.4 pg (ref 26.0–34.0)
MCHC: 34.8 g/dL (ref 30.0–36.0)
MCV: 93.1 fL (ref 80.0–100.0)
Platelets: 238 10*3/uL (ref 150–400)
RBC: 4.35 MIL/uL (ref 4.22–5.81)
RDW: 12.4 % (ref 11.5–15.5)
WBC: 5.6 10*3/uL (ref 4.0–10.5)
nRBC: 0 % (ref 0.0–0.2)

## 2020-02-18 NOTE — Progress Notes (Addendum)
COVID Vaccine Completed: x2 Date COVID Vaccine completed: 05-25-19 & 06-15-19 COVID vaccine manufacturer: Pfizer    Moderna   [employer]   PCP - [name], MD Cardiologist -   Chest x-ray -  EKG - 02-18-20 in Epic Stress Test -  ECHO -  Cardiac Cath -  Pacemaker/ICD device last checked:  Sleep Study -  CPAP -   Fasting Blood Sugar -  Checks Blood Sugar _____ times a day  Blood Thinner Instructions: Aspirin Instructions:   Last Dose:  Anesthesia review:   Patient denies shortness of breath, fever, cough and chest pain at PAT appointment   Patient verbalized understanding of instructions that were given to them at the PAT appointment. Patient was also instructed that they will need to review over the PAT instructions again at home before surgery.

## 2020-02-18 NOTE — Patient Instructions (Addendum)
DUE TO COVID-19 ONLY ONE VISITOR IS ALLOWED TO COME WITH YOU AND STAY IN THE WAITING ROOM  ONLY  DURING PRE OP AND PROCEDURE.   IF YOU WILL BE ADMITTED INTO THE HOSPITAL YOU ARE ALLOWED ONE SUPPORT PERSON DURING  VISITATION  HOURS ONLY (10AM -8PM)   . The support person may change daily. . The support person must pass our screening, gel in and out, and wear a mask at all times, including in the patient's room. . Patients must also wear a mask when staff or their support person are in the room.         Your procedure is scheduled on:  Tuesday, 02-22-20   Report to [facility] Main  Entrance   Report to admitting at 12:00 PM   Call this number if you have problems the morning of surgery [phone]   Do not eat food :After Midnight.   May have liquids until 11:00 AM  day of surgery  CLEAR LIQUID DIET  Foods Allowed                                                                     Foods Excluded  Water, Black Coffee and tea, regular and decaf              liquids that you cannot  Plain Jell-O in any flavor  (No red)                                    see through such as: Fruit ices (not with fruit pulp)                                      milk, soups, orange juice              Iced Popsicles (No red)                                      All solid food                                   Apple juices Sports drinks like Gatorade (No red) Lightly seasoned clear broth or consume(fat free) Sugar, honey syrup   Oral Hygiene is also important to reduce your risk of infection.                                    Remember - BRUSH YOUR TEETH THE MORNING OF SURGERY WITH YOUR REGULAR TOOTHPASTE   Do NOT smoke after Midnight   Take these medicines the morning of surgery with A SIP OF WATER: Atenolol and Duloxetine                                You may not have any metal on your body  including  jewelry, and body piercings             Do not wear  lotions, powders,  perfumes/cologne, or deodorant             Men may shave face and neck.   Do not bring valuables to the hospital. [place].   Contacts, dentures or bridgework may not be worn into surgery.   Patients discharged the day of surgery will not be allowed to drive home.   Special Instructions: Bring a copy of your healthcare power of attorney and living will documents  the day of surgery if  you haven't scanned them in before.              Please read over the following fact sheets you were given: IF YOU HAVE QUESTIONS ABOUT YOUR PRE OP INSTRUCTIONS PLEASE CALL [phone]   [facility] - Preparing for Surgery Before surgery, you can play an important role.  Because skin is not sterile, your skin needs to be as free of germs as possible.  You can reduce the number of germs on your skin by washing with CHG (chlorahexidine gluconate) soap before surgery.  CHG is an antiseptic cleaner which kills germs and bonds with the skin to continue killing germs even after washing. Please DO NOT use if you have an allergy to CHG or antibacterial soaps.  If your skin becomes reddened/irritated stop using the CHG and inform your nurse when you arrive at Short Stay. Do not shave (including legs and underarms) for at least 48 hours prior to the first CHG shower.  You may shave your face/neck.  Please follow these instructions carefully:  1.  Shower with CHG Soap the night before surgery and the  morning of surgery.  2.  If you choose to wash your hair, wash your hair first as usual with your normal  shampoo.  3.  After you shampoo, rinse your hair and body thoroughly to remove the shampoo.                             4.  Use CHG as you would any other liquid soap.  You can apply chg directly to the skin and wash.  Gently with a scrungie or clean washcloth.  5.  Apply the CHG Soap to your body ONLY FROM THE NECK DOWN.   Do   not use on face/ open                           Wound  or open sores. Avoid contact with eyes, ears mouth and   genitals (private parts).                       Wash face,  Genitals (private parts) with your normal soap.             6.  Wash thoroughly, paying special attention to the area where your    surgery  will be performed.  7.  Thoroughly rinse your body with warm water from the neck down.  8.  DO NOT shower/wash with your normal soap after using and rinsing off the CHG Soap.                9.  Pat yourself dry with a clean towel.  10.  Wear clean pajamas.            11.  Place clean sheets on your bed the night of your first shower and do not  sleep with pets. Day of Surgery : Do not apply any lotions/deodorants the morning of surgery.  Please wear clean clothes to the hospital/surgery center.  FAILURE TO FOLLOW THESE INSTRUCTIONS MAY RESULT IN THE CANCELLATION OF YOUR SURGERY  PATIENT SIGNATURE_________________________________  NURSE SIGNATURE__________________________________  ________________________________________________________________________

## 2020-02-19 ENCOUNTER — Other Ambulatory Visit (HOSPITAL_COMMUNITY): Payer: Medicare Other

## 2020-02-19 LAB — SARS CORONAVIRUS 2 (TAT 6-24 HRS): SARS Coronavirus 2: NEGATIVE

## 2020-02-21 MED ORDER — BUPIVACAINE LIPOSOME 1.3 % IJ SUSP
20.0000 mL | Freq: Once | INTRAMUSCULAR | Status: DC
  Filled 2020-02-21: qty 20

## 2020-02-22 ENCOUNTER — Ambulatory Visit (HOSPITAL_COMMUNITY): Payer: Medicare Other | Admitting: Physician Assistant

## 2020-02-22 ENCOUNTER — Encounter (HOSPITAL_COMMUNITY): Payer: Self-pay | Admitting: Surgery

## 2020-02-22 ENCOUNTER — Ambulatory Visit (HOSPITAL_COMMUNITY): Payer: Medicare Other | Admitting: Registered Nurse

## 2020-02-22 ENCOUNTER — Encounter (HOSPITAL_COMMUNITY)
Admission: RE | Disposition: A | Discharge status: Home / Self Care | Payer: Self-pay | Source: Other Acute Inpatient Hospital | Attending: Surgery

## 2020-02-22 ENCOUNTER — Ambulatory Visit (HOSPITAL_COMMUNITY)
Admission: RE | Admit: 2020-02-22 | Discharge: 2020-02-22 | Disposition: A | Discharge status: Home / Self Care | Payer: Medicare Other | Source: Other Acute Inpatient Hospital | Attending: Surgery | Admitting: Surgery

## 2020-02-22 DIAGNOSIS — K828 Other specified diseases of gallbladder: Secondary | ICD-10-CM | POA: Diagnosis not present

## 2020-02-22 DIAGNOSIS — I10 Essential (primary) hypertension: Secondary | ICD-10-CM | POA: Diagnosis not present

## 2020-02-22 DIAGNOSIS — F419 Anxiety disorder, unspecified: Secondary | ICD-10-CM | POA: Diagnosis not present

## 2020-02-22 DIAGNOSIS — Z8546 Personal history of malignant neoplasm of prostate: Secondary | ICD-10-CM | POA: Diagnosis not present

## 2020-02-22 DIAGNOSIS — K811 Chronic cholecystitis: Secondary | ICD-10-CM | POA: Insufficient documentation

## 2020-02-22 DIAGNOSIS — Z87891 Personal history of nicotine dependence: Secondary | ICD-10-CM | POA: Diagnosis not present

## 2020-02-22 DIAGNOSIS — K8044 Calculus of bile duct with chronic cholecystitis without obstruction: Secondary | ICD-10-CM | POA: Diagnosis present

## 2020-02-22 DIAGNOSIS — K420 Umbilical hernia with obstruction, without gangrene: Secondary | ICD-10-CM | POA: Insufficient documentation

## 2020-02-22 DIAGNOSIS — Z79899 Other long term (current) drug therapy: Secondary | ICD-10-CM | POA: Diagnosis not present

## 2020-02-22 DIAGNOSIS — R519 Headache, unspecified: Secondary | ICD-10-CM | POA: Diagnosis not present

## 2020-02-22 DIAGNOSIS — G4730 Sleep apnea, unspecified: Secondary | ICD-10-CM | POA: Insufficient documentation

## 2020-02-22 DIAGNOSIS — Z800 Family history of malignant neoplasm of digestive organs: Secondary | ICD-10-CM | POA: Diagnosis not present

## 2020-02-22 DIAGNOSIS — Z87442 Personal history of urinary calculi: Secondary | ICD-10-CM | POA: Insufficient documentation

## 2020-02-22 DIAGNOSIS — Z7982 Long term (current) use of aspirin: Secondary | ICD-10-CM | POA: Diagnosis not present

## 2020-02-22 HISTORY — PX: CHOLECYSTECTOMY: SHX55

## 2020-02-22 HISTORY — PX: UMBILICAL HERNIA REPAIR: SHX196

## 2020-02-22 SURGERY — LAPAROSCOPIC CHOLECYSTECTOMY WITH INTRAOPERATIVE CHOLANGIOGRAM
Anesthesia: General | Site: Abdomen

## 2020-02-22 MED ORDER — AMISULPRIDE (ANTIEMETIC) 5 MG/2ML IV SOLN: 10.0000 mg | Freq: Once | INTRAVENOUS | Status: DC | PRN

## 2020-02-22 MED ORDER — BUPIVACAINE-EPINEPHRINE (PF) 0.5% -1:200000 IJ SOLN
INTRAMUSCULAR | Status: AC
  Filled 2020-02-22: qty 30

## 2020-02-22 MED ORDER — DEXAMETHASONE SODIUM PHOSPHATE 10 MG/ML IJ SOLN
INTRAMUSCULAR | Status: AC
  Filled 2020-02-22: qty 1

## 2020-02-22 MED ORDER — CEFAZOLIN SODIUM-DEXTROSE 2-4 GM/100ML-% IV SOLN
2.0000 g | INTRAVENOUS | Status: AC
  Administered 2020-02-22: 2 g via INTRAVENOUS
  Filled 2020-02-22: qty 100

## 2020-02-22 MED ORDER — FENTANYL CITRATE (PF) 100 MCG/2ML IJ SOLN
INTRAMUSCULAR | Status: AC
  Filled 2020-02-22: qty 2

## 2020-02-22 MED ORDER — OXYCODONE HCL 5 MG/5ML PO SOLN: 5.0000 mg | Freq: Once | ORAL | Status: DC | PRN

## 2020-02-22 MED ORDER — LACTATED RINGERS IV SOLN
INTRAVENOUS | Status: DC | PRN
  Administered 2020-02-22: 1000 mL

## 2020-02-22 MED ORDER — DEXAMETHASONE SODIUM PHOSPHATE 10 MG/ML IJ SOLN
INTRAMUSCULAR | Status: DC | PRN
  Administered 2020-02-22 (×2): 5 mg via INTRAVENOUS

## 2020-02-22 MED ORDER — LACTATED RINGERS IV SOLN: INTRAVENOUS | Status: DC

## 2020-02-22 MED ORDER — CHLORHEXIDINE GLUCONATE 0.12 % MT SOLN
15.0000 mL | Freq: Once | OROMUCOSAL | Status: AC
  Administered 2020-02-22: 15 mL via OROMUCOSAL

## 2020-02-22 MED ORDER — ONDANSETRON HCL 4 MG/2ML IJ SOLN
INTRAMUSCULAR | Status: AC
  Filled 2020-02-22: qty 2

## 2020-02-22 MED ORDER — MIDAZOLAM HCL 2 MG/2ML IJ SOLN
INTRAMUSCULAR | Status: AC
  Filled 2020-02-22: qty 2

## 2020-02-22 MED ORDER — OXYCODONE HCL 5 MG PO TABS: 5.0000 mg | ORAL_TABLET | Freq: Three times a day (TID) | ORAL | 0 refills | Status: AC | PRN

## 2020-02-22 MED ORDER — ORAL CARE MOUTH RINSE: 15.0000 mL | Freq: Once | OROMUCOSAL | Status: AC

## 2020-02-22 MED ORDER — CHLORHEXIDINE GLUCONATE 4 % EX LIQD: 60.0000 mL | Freq: Once | CUTANEOUS | Status: DC

## 2020-02-22 MED ORDER — LIDOCAINE 2% (20 MG/ML) 5 ML SYRINGE
INTRAMUSCULAR | Status: AC
  Filled 2020-02-22: qty 5

## 2020-02-22 MED ORDER — PROPOFOL 10 MG/ML IV BOLUS
INTRAVENOUS | Status: DC | PRN
  Administered 2020-02-22: 150 mg via INTRAVENOUS

## 2020-02-22 MED ORDER — ONDANSETRON HCL 4 MG/2ML IJ SOLN
INTRAMUSCULAR | Status: DC | PRN
  Administered 2020-02-22: 4 mg via INTRAVENOUS

## 2020-02-22 MED ORDER — ROCURONIUM BROMIDE 10 MG/ML (PF) SYRINGE
PREFILLED_SYRINGE | INTRAVENOUS | Status: DC | PRN
  Administered 2020-02-22: 50 mg via INTRAVENOUS
  Administered 2020-02-22: 5 mg via INTRAVENOUS

## 2020-02-22 MED ORDER — SCOPOLAMINE 1 MG/3DAYS TD PT72
MEDICATED_PATCH | TRANSDERMAL | Status: AC
  Filled 2020-02-22: qty 1

## 2020-02-22 MED ORDER — MIDAZOLAM HCL 5 MG/5ML IJ SOLN
INTRAMUSCULAR | Status: DC | PRN
  Administered 2020-02-22: 2 mg via INTRAVENOUS

## 2020-02-22 MED ORDER — ONDANSETRON HCL 4 MG/2ML IJ SOLN: 4.0000 mg | Freq: Once | INTRAMUSCULAR | Status: DC | PRN

## 2020-02-22 MED ORDER — LIDOCAINE 2% (20 MG/ML) 5 ML SYRINGE
INTRAMUSCULAR | Status: DC | PRN
  Administered 2020-02-22: 60 mg via INTRAVENOUS

## 2020-02-22 MED ORDER — SUGAMMADEX SODIUM 200 MG/2ML IV SOLN
INTRAVENOUS | Status: DC | PRN
  Administered 2020-02-22: 200 mg via INTRAVENOUS

## 2020-02-22 MED ORDER — ACETAMINOPHEN 500 MG PO TABS
1000.0000 mg | ORAL_TABLET | ORAL | Status: AC
  Administered 2020-02-22: 1000 mg via ORAL
  Filled 2020-02-22: qty 2

## 2020-02-22 MED ORDER — FENTANYL CITRATE (PF) 250 MCG/5ML IJ SOLN
INTRAMUSCULAR | Status: DC | PRN
  Administered 2020-02-22 (×2): 50 ug via INTRAVENOUS
  Administered 2020-02-22: 100 ug via INTRAVENOUS
  Administered 2020-02-22: 50 ug via INTRAVENOUS

## 2020-02-22 MED ORDER — BUPIVACAINE-EPINEPHRINE 0.25% -1:200000 IJ SOLN
INTRAMUSCULAR | Status: DC | PRN
  Administered 2020-02-22: 30 mL

## 2020-02-22 MED ORDER — FENTANYL CITRATE (PF) 250 MCG/5ML IJ SOLN
INTRAMUSCULAR | Status: AC
  Filled 2020-02-22: qty 5

## 2020-02-22 MED ORDER — ROCURONIUM BROMIDE 10 MG/ML (PF) SYRINGE
PREFILLED_SYRINGE | INTRAVENOUS | Status: AC
  Filled 2020-02-22: qty 10

## 2020-02-22 MED ORDER — OXYCODONE HCL 5 MG PO TABS: 5.0000 mg | ORAL_TABLET | Freq: Once | ORAL | Status: DC | PRN

## 2020-02-22 MED ORDER — DOCUSATE SODIUM 100 MG PO CAPS: 100.0000 mg | ORAL_CAPSULE | Freq: Two times a day (BID) | ORAL | 0 refills | Status: AC

## 2020-02-22 MED ORDER — FENTANYL CITRATE (PF) 100 MCG/2ML IJ SOLN
25.0000 ug | INTRAMUSCULAR | Status: DC | PRN
  Administered 2020-02-22 (×3): 50 ug via INTRAVENOUS

## 2020-02-22 MED ORDER — BUPIVACAINE LIPOSOME 1.3 % IJ SUSP
INTRAMUSCULAR | Status: DC | PRN
  Administered 2020-02-22: 20 mL

## 2020-02-22 SURGICAL SUPPLY — 52 items
APPLIER CLIP 5 13 M/L LIGAMAX5 (MISCELLANEOUS) ×2
APPLIER CLIP ROT 10 11.4 M/L (STAPLE) ×2
BENZOIN TINCTURE PRP APPL 2/3 (GAUZE/BANDAGES/DRESSINGS) IMPLANT
CABLE HIGH FREQUENCY MONO STRZ (ELECTRODE) ×2 IMPLANT
CHLORAPREP W/TINT 26 (MISCELLANEOUS) ×2 IMPLANT
CLIP APPLIE 5 13 M/L LIGAMAX5 (MISCELLANEOUS) ×1 IMPLANT
CLIP APPLIE ROT 10 11.4 M/L (STAPLE) ×1 IMPLANT
COVER MAYO STAND STRL (DRAPES) IMPLANT
COVER SURGICAL LIGHT HANDLE (MISCELLANEOUS) ×2 IMPLANT
COVER WAND RF STERILE (DRAPES) IMPLANT
DECANTER SPIKE VIAL GLASS SM (MISCELLANEOUS) ×2 IMPLANT
DERMABOND ADVANCED (GAUZE/BANDAGES/DRESSINGS) ×1
DERMABOND ADVANCED .7 DNX12 (GAUZE/BANDAGES/DRESSINGS) ×1 IMPLANT
DRAPE C-ARM 42X120 X-RAY (DRAPES) IMPLANT
DRAPE LAPAROSCOPIC ABDOMINAL (DRAPES) ×2 IMPLANT
DRSG TEGADERM 4X4.75 (GAUZE/BANDAGES/DRESSINGS) ×2 IMPLANT
ELECT REM PT RETURN 15FT ADLT (MISCELLANEOUS) ×2 IMPLANT
ENDOLOOP SUT PDS II  0 18 (SUTURE) ×2
ENDOLOOP SUT PDS II 0 18 (SUTURE) ×1 IMPLANT
GAUZE SPONGE 2X2 8PLY STRL LF (GAUZE/BANDAGES/DRESSINGS) ×1 IMPLANT
GAUZE SPONGE 4X4 12PLY STRL (GAUZE/BANDAGES/DRESSINGS) IMPLANT
GLOVE BIO SURGEON STRL SZ 6 (GLOVE) ×2 IMPLANT
GLOVE INDICATOR 6.5 STRL GRN (GLOVE) ×2 IMPLANT
GOWN STRL REUS W/TWL LRG LVL3 (GOWN DISPOSABLE) ×2 IMPLANT
GOWN STRL REUS W/TWL XL LVL3 (GOWN DISPOSABLE) ×4 IMPLANT
GRASPER SUT TROCAR 14GX15 (MISCELLANEOUS) ×2 IMPLANT
HEMOSTAT SNOW SURGICEL 2X4 (HEMOSTASIS) ×2 IMPLANT
KIT BASIN OR (CUSTOM PROCEDURE TRAY) ×2 IMPLANT
KIT TURNOVER KIT A (KITS) IMPLANT
NEEDLE HYPO 22GX1.5 SAFETY (NEEDLE) IMPLANT
NEEDLE INSUFFLATION 14GA 120MM (NEEDLE) ×2 IMPLANT
PACK GENERAL/GYN (CUSTOM PROCEDURE TRAY) ×2 IMPLANT
PENCIL SMOKE EVACUATOR (MISCELLANEOUS) IMPLANT
POUCH SPECIMEN RETRIEVAL 10MM (ENDOMECHANICALS) ×2 IMPLANT
SCISSORS LAP 5X35 DISP (ENDOMECHANICALS) ×2 IMPLANT
SET CHOLANGIOGRAPH MIX (MISCELLANEOUS) IMPLANT
SET IRRIG TUBING LAPAROSCOPIC (IRRIGATION / IRRIGATOR) ×2 IMPLANT
SET TUBE SMOKE EVAC HIGH FLOW (TUBING) ×2 IMPLANT
SLEEVE XCEL OPT CAN 5 100 (ENDOMECHANICALS) ×4 IMPLANT
SPONGE GAUZE 2X2 STER 10/PKG (GAUZE/BANDAGES/DRESSINGS) ×1
STRIP CLOSURE SKIN 1/2X4 (GAUZE/BANDAGES/DRESSINGS) IMPLANT
SUT ETHIBOND 0 MO6 C/R (SUTURE) IMPLANT
SUT MNCRL AB 4-0 PS2 18 (SUTURE) ×2 IMPLANT
SUT PROLENE 2 0 CT2 30 (SUTURE) IMPLANT
SUT VIC AB 3-0 SH 27 (SUTURE) ×2
SUT VIC AB 3-0 SH 27XBRD (SUTURE) ×1 IMPLANT
SYR CONTROL 10ML LL (SYRINGE) IMPLANT
TOWEL OR 17X26 10 PK STRL BLUE (TOWEL DISPOSABLE) ×2 IMPLANT
TOWEL OR NON WOVEN STRL DISP B (DISPOSABLE) ×2 IMPLANT
TRAY LAPAROSCOPIC (CUSTOM PROCEDURE TRAY) ×2 IMPLANT
TROCAR BLADELESS OPT 5 100 (ENDOMECHANICALS) ×2 IMPLANT
TROCAR XCEL 12X100 BLDLESS (ENDOMECHANICALS) ×2 IMPLANT

## 2020-02-22 NOTE — Transfer of Care (Signed)
Immediate Anesthesia Transfer of Care Note  Patient: Blake Ward  Procedure(s) Performed: LAPAROSCOPIC CHOLECYSTECTOMY (N/A Abdomen) HERNIA REPAIR UMBILICAL (N/A Abdomen)  Patient Location: PACU  Anesthesia Type:General  Level of Consciousness: awake and patient cooperative  Airway & Oxygen Therapy: Patient Spontanous Breathing and Patient connected to face mask oxygen  Post-op Assessment: Report given to RN and Post -op Vital signs reviewed and stable  Post vital signs: Reviewed and stable  Last Vitals:  Vitals Value Taken Time  BP    Temp    Pulse 78 02/22/20 1522  Resp 8 02/22/20 1522  SpO2 100 % 02/22/20 1522  Vitals shown include unvalidated device data.  Last Pain:  Vitals:   02/22/20 1219  TempSrc:   PainSc: 0-No pain         Complications: No complications documented.

## 2020-02-22 NOTE — Anesthesia Procedure Notes (Signed)
Procedure Name: Intubation Date/Time: 02/22/2020 1:51 PM Performed by: Talbot Grumbling, CRNA Pre-anesthesia Checklist: Patient identified, Emergency Drugs available, Suction available and Patient being monitored Patient Re-evaluated:Patient Re-evaluated prior to induction Oxygen Delivery Method: Circle system utilized Preoxygenation: Pre-oxygenation with 100% oxygen Induction Type: IV induction Ventilation: Mask ventilation without difficulty and Oral airway inserted - appropriate to patient size Laryngoscope Size: Sabra Heck and 2 Grade View: Grade I Tube type: Oral Number of attempts: 1 Placement Confirmation: ETT inserted through vocal cords under direct vision,  positive ETCO2 and breath sounds checked- equal and bilateral Secured at: 23 cm Tube secured with: Tape Dental Injury: Teeth and Oropharynx as per pre-operative assessment  Comments: Performed by Burnadette Pop

## 2020-02-22 NOTE — Op Note (Signed)
Operative Note  Blake Ward 69 y.o. male [id number]  02/22/2020  Surgeon: Clovis Riley MD FACS  Procedure performed: 1. Laparoscopic Cholecystectomy 2. Open primary repair of incarcerated umbilical hernia  Preop diagnosis: Biliary colic, incarcerated umbilical hernia Post-op diagnosis/intraop findings: same  Specimens: gallbladder  Retained items: none  EBL: minimal  Complications: none  Description of procedure: After obtaining informed consent the patient was brought to the operating room. Antibiotics were administered. SCD's were applied. General endotracheal anesthesia was initiated and a formal time-out was performed. The abdomen was prepped and draped in the usual sterile fashion.   An infraumbilical curvilinear incision was made and the soft tissues dissected with cautery until the umbilical hernia sac was encountered.  This was separated from the overlying skin and skeletonized down to the level of the fascia, where it was excised, removing chronically incarcerated preperitoneal fat along with the hernia sac.  A 11 mm trocar was placed through the fascial defect which was about 9 mm in diameter initially.  The abdomen was then insufflated to 15 mmHg without incident. Gross inspection revealed no evidence of injury from our entry or other intraabdominal abnormalities. Three 34mm trocars were introduced in the epigastric, right midclavicular and right anterior axillary lines under direct visualization and following infiltration with local.  Omental adhesions to the gallbladder were lysed bluntly and with cautery where necessary, taking care to protect the nearby colon and duodenum which were well visualized.  The gallbladder was retracted cephalad and the infundibulum was retracted laterally. A combination of hook electrocautery and blunt dissection was utilized to clear the peritoneum from the neck and cystic duct, circumferentially isolating the cystic artery and cystic duct and  lifting the gallbladder from the cystic plate. The critical view of safety was achieved with the cystic artery, cystic duct, and liver bed visualized between them with no other structures. The artery was clipped with two clips proximally and one distally and divided as was the cystic duct with three clips on the proximal end.  There was a small posterior branch vessel which was addressed with a single clip as well.  The cystic duct was somewhat patulous and therefore a 0 PDS Endoloop was placed on the cystic duct remnant, just proximal to the clips.  The gallbladder was dissected from the liver plate using electrocautery. Once freed the gallbladder was placed in an endocatch bag and removed through the umbilical trocar site. A small amount of bleeding on the liver bed was controlled with cautery and Surgicel snow was placed. Some bile had been spilled from the gallbladder during its dissection from the liver bed. This was aspirated and the right upper quadrant was irrigated copiously until the effluent was clear. Hemostasis was once again confirmed, and reinspection of the abdomen revealed no injuries. The clips were well opposed without any bile leak from the duct or the liver bed.   We then turned to the umbilical hernia defect.  The 11 mm trocar was removed and there was noted to be some bleeding from the incised peritoneum.  This was cauterized.  The fascial defect was then closed with simple interrupted 0 Ethibonds.  The abdomen was reinsufflated and the repair was inspected, there were no entrapped structures and the repair was airtight, however there was ongoing oozing from the patient's right side of the peritoneum and preperitoneal fat.  A figure-of-eight transfascial 0 Vicryl suture was placed using a laparoscopic suture passer and this resolved the bleeding.  Small amount of blood and  clot were evacuated from the pelvis and this area was irrigated.  Hemostasis was once again confirmed. The abdomen was  desufflated and all trocars removed.  The umbilical skin was reapposed to the fascia with a 3-0 Vicryl.  The skin incisions were closed with subcuticular monocryl and Dermabond. A light pressure dressing was applied to the umbilicus. The patient was awakened, extubated and transported to the recovery room in stable condition.    All counts were correct at the completion of the case.

## 2020-02-22 NOTE — Interval H&P Note (Signed)
History and Physical Interval Note:  02/22/2020 1:14 PM  Blake Ward  has presented today for surgery, with the diagnosis of BILIARY COLIC, UMBILICAL HERNIA.  The various methods of treatment have been discussed with the patient and family. After consideration of risks, benefits and other options for treatment, the patient has consented to  Procedure(s): LAPAROSCOPIC CHOLECYSTECTOMY WITH INTRAOPERATIVE CHOLANGIOGRAM (N/A) HERNIA REPAIR UMBILICAL (N/A) as a surgical intervention.  The patient's history has been reviewed, patient examined, no change in status, stable for surgery.  I have reviewed the patient's chart and labs.  Questions were answered to the patient's satisfaction.     Akira Perusse Rich Brave

## 2020-02-22 NOTE — Anesthesia Preprocedure Evaluation (Signed)
Anesthesia Evaluation  Patient identified by MRN, date of birth, ID band Patient awake    Reviewed: Allergy & Precautions, NPO status , Patient's Chart, lab work & pertinent test results  History of Anesthesia Complications Negative for: history of anesthetic complications  Airway Mallampati: II  TM Distance: >3 FB Neck ROM: Full    Dental  (+) Teeth Intact   Pulmonary neg pulmonary ROS, former smoker,    Pulmonary exam normal        Cardiovascular hypertension, Pt. on medications Normal cardiovascular exam     Neuro/Psych negative neurological ROS  negative psych ROS   GI/Hepatic negative GI ROS, Neg liver ROS,   Endo/Other  negative endocrine ROS  Renal/GU negative Renal ROS  negative genitourinary   Musculoskeletal negative musculoskeletal ROS (+)   Abdominal   Peds  Hematology negative hematology ROS (+)   Anesthesia Other Findings   Reproductive/Obstetrics                            Anesthesia Physical Anesthesia Plan  ASA: II  Anesthesia Plan: General   Post-op Pain Management:    Induction: Intravenous  PONV Risk Score and Plan: 3 and Ondansetron, Dexamethasone, Treatment may vary due to age or medical condition and Midazolam  Airway Management Planned: Oral ETT  Additional Equipment: None  Intra-op Plan:   Post-operative Plan: Extubation in OR  Informed Consent: I have reviewed the patients History and Physical, chart, labs and discussed the procedure including the risks, benefits and alternatives for the proposed anesthesia with the patient or authorized representative who has indicated his/her understanding and acceptance.     Dental advisory given  Plan Discussed with:   Anesthesia Plan Comments:         Anesthesia Quick Evaluation

## 2020-02-22 NOTE — Discharge Instructions (Signed)
LAPAROSCOPIC SURGERY: POST OP INSTRUCTIONS   EAT Gradually transition to a high fiber diet with a fiber supplement over the next few weeks after discharge.  Start with a pureed / full liquid diet (see below)  WALK Walk an hour a day.  Control your pain to do that.    CONTROL PAIN Control pain so that you can walk, sleep, tolerate sneezing/coughing, go up/down stairs.  HAVE A BOWEL MOVEMENT DAILY Keep your bowels regular to avoid problems.  OK to try a laxative to override constipation.  OK to use an antidairrheal to slow down diarrhea.  Call if not better after 2 tries  CALL IF YOU HAVE PROBLEMS/CONCERNS Call if you are still struggling despite following these instructions. Call if you have concerns not answered by these instructions    1. DIET: Follow a light bland diet & liquids the first 24 hours after arrival home, such as soup, liquids, starches, etc.  Be sure to drink plenty of fluids.  Quickly advance to a usual solid diet within a few days.  Avoid fast food or heavy meals as your are more likely to get nauseated or have irregular bowels.  A low-sugar, high-fiber diet for the rest of your life is ideal.  2. Take your usually prescribed home medications unless otherwise directed.  3. PAIN CONTROL: a. Pain is best controlled by a usual combination of three different methods TOGETHER: i. Ice/Heat ii. Over the counter pain medication iii. Prescription pain medication b. Most patients will experience some swelling and bruising around the incisions.  Ice packs or heating pads (30-60 minutes up to 6 times a day) will help. Use ice for the first few days to help decrease swelling and bruising, then switch to heat to help relax tight/sore spots and speed recovery.  Some people prefer to use ice alone, heat alone, alternating between ice & heat.  Experiment to what works for you.  Swelling and bruising can take several weeks to resolve.   c. It is helpful to take an over-the-counter pain  medication regularly for the first few days: i. Naproxen (Aleve, etc)  Two 220mg  tabs twice a day OR Ibuprofen (Advil, etc) Three 200mg  tabs four times a day (every meal & bedtime) AND ii. Acetaminophen (Tylenol, etc) 500-650mg  four times a day (every meal & bedtime) d. A  prescription for pain medication (such as oxycodone, hydrocodone, tramadol, gabapentin, methocarbamol, etc) should be given to you upon discharge.  Take your pain medication as prescribed, IF NEEDED.  i. If you are having problems/concerns with the prescription medicine (does not control pain, nausea, vomiting, rash, itching, etc), please call us [phone] to see if we need to switch you to a different pain medicine that will work better for you and/or control your side effect better. ii. If you need a refill on your pain medication, please give [place] 48 hour notice.  contact your pharmacy.  They will contact our office to request authorization. Prescriptions will not be filled after 5 pm or on week-ends  4. Avoid getting constipated.   a. Between the surgery and the pain medications, it is common to experience some constipation.   b. Increasing fluid intake and taking a fiber supplement (such as Metamucil, Citrucel, FiberCon, MiraLax, etc) 1-2 times a day regularly will usually help prevent this problem from occurring.   c. A mild laxative (prune juice, Milk of Magnesia, MiraLax, etc) should be taken according to package directions if there are no bowel movements after 48 hours.  5. Watch out for diarrhea.   a. If you have many loose bowel movements, simplify your diet to bland foods & liquids for a few days.   b. Stop any stool softeners and decrease your fiber supplement.   c. Switching to mild anti-diarrheal medications (Kayopectate, Pepto Bismol) can help.   d. If this worsens or does not improve, please call us.  6. Wash / shower every day, starting 2 days after surgery.  You may shower over the skin glue which is  waterproof  7. Remove the dressing at your umbilicus 2 days after surgery.  Glue on the other incisions will flake off after about 2 weeks.  You may leave the incision open to air.  You may replace a dressing/Band-Aid to cover the incision for comfort if you wish.   8. ACTIVITIES as tolerated:   a. You may resume regular (light) daily activities beginning the next day--such as daily self-care, walking, climbing stairs--gradually increasing activities as tolerated.  If you can walk 30 minutes without difficulty, it is safe to try more intense activity such as jogging, treadmill, bicycling, low-impact aerobics, swimming, etc. b. Refrain from the most intensive and strenuous activity such as sit-ups, heavy lifting, contact sports, etc  Refrain from any heavy lifting or straining until 6 weeks after surgery.   c. DO NOT PUSH THROUGH PAIN.  Let pain be your guide: If it hurts to do something, don't do it.  Pain is your body warning you to avoid that activity for another week until the pain goes down. d. You may drive when you are no longer taking prescription pain medication, you can comfortably wear a seatbelt, and you can safely maneuver your car and apply brakes. e. Dennis Bast may have sexual intercourse when it is comfortable.  9. FOLLOW UP in our office a. Please call [facility] at [phone] to set up an appointment to see your surgeon in the office for a follow-up appointment approximately 2-3 weeks after your surgery. b. Make sure that you call for this appointment the day you arrive home to insure a convenient appointment time.  10. IF YOU HAVE DISABILITY OR FAMILY LEAVE FORMS, BRING THEM TO THE OFFICE FOR PROCESSING.  DO NOT GIVE THEM TO YOUR DOCTOR.   WHEN TO CALL us [phone]: 1. Poor pain control 2. Reactions / problems with new medications (rash/itching, nausea, etc)  3. Fever over 101.5 F (38.5 C) 4. Inability to urinate 5. Nausea and/or vomiting 6. Worsening swelling or  bruising 7. Continued bleeding from incision. 8. Increased pain, redness, or drainage from the incision   The clinic staff is available to answer your questions during regular business hours (8:30am-5pm).  Please don't hesitate to call and ask to speak to one of our nurses for clinical concerns.   If you have a medical emergency, go to the nearest emergency room or call 911.  A surgeon from [facility] is always on call at the [facility], [place], [place], [place], [place]  [address] ? MAIN: [phone] ? TOLL FREE: [phone] ?  FAX [phone] [email or web address]

## 2020-02-22 NOTE — Anesthesia Postprocedure Evaluation (Signed)
Anesthesia Post Note  Patient: Blake Ward  Procedure(s) Performed: LAPAROSCOPIC CHOLECYSTECTOMY (N/A Abdomen) HERNIA REPAIR UMBILICAL (N/A Abdomen)     Patient location during evaluation: PACU Anesthesia Type: General Level of consciousness: awake and alert Pain management: pain level controlled Vital Signs Assessment: post-procedure vital signs reviewed and stable Respiratory status: spontaneous breathing, nonlabored ventilation and respiratory function stable Cardiovascular status: blood pressure returned to baseline and stable Postop Assessment: no apparent nausea or vomiting Anesthetic complications: no   No complications documented.  Last Vitals:  Vitals:   02/22/20 1600 02/22/20 1605  BP: (!) 167/86 (!) 171/93  Pulse: 76   Resp: 15   Temp: 36.6 C (!) 36.4 C  SpO2: 100% 100%    Last Pain:  Vitals:   02/22/20 1605  TempSrc:   PainSc: 0-No pain                 Lidia Collum

## 2020-02-23 ENCOUNTER — Encounter (HOSPITAL_COMMUNITY): Payer: Self-pay | Admitting: Surgery

## 2020-02-23 LAB — SURGICAL PATHOLOGY

## 2020-05-08 DIAGNOSIS — I10 Essential (primary) hypertension: Secondary | ICD-10-CM | POA: Diagnosis not present

## 2020-05-08 DIAGNOSIS — E7800 Pure hypercholesterolemia, unspecified: Secondary | ICD-10-CM | POA: Diagnosis not present

## 2020-05-30 DIAGNOSIS — Z01812 Encounter for preprocedural laboratory examination: Secondary | ICD-10-CM | POA: Diagnosis not present

## 2020-05-30 DIAGNOSIS — K921 Melena: Secondary | ICD-10-CM | POA: Diagnosis not present

## 2020-06-02 DIAGNOSIS — D125 Benign neoplasm of sigmoid colon: Secondary | ICD-10-CM | POA: Diagnosis not present

## 2020-06-02 DIAGNOSIS — K921 Melena: Secondary | ICD-10-CM | POA: Diagnosis not present

## 2020-06-06 DIAGNOSIS — D125 Benign neoplasm of sigmoid colon: Secondary | ICD-10-CM | POA: Diagnosis not present

## 2020-06-08 DIAGNOSIS — J330 Polyp of nasal cavity: Secondary | ICD-10-CM | POA: Diagnosis not present

## 2020-07-18 DIAGNOSIS — I10 Essential (primary) hypertension: Secondary | ICD-10-CM | POA: Diagnosis not present

## 2020-07-18 DIAGNOSIS — Z0000 Encounter for general adult medical examination without abnormal findings: Secondary | ICD-10-CM | POA: Diagnosis not present

## 2020-07-18 DIAGNOSIS — E7800 Pure hypercholesterolemia, unspecified: Secondary | ICD-10-CM | POA: Diagnosis not present

## 2020-07-18 DIAGNOSIS — R7309 Other abnormal glucose: Secondary | ICD-10-CM | POA: Diagnosis not present

## 2020-08-04 IMAGING — US US ABDOMEN LIMITED
1 series · 13 of 25 positions shown · non-contrast
Comparison: [facility] abdominal radiographs 03/20/2014.

CLINICAL DATA: 69-year-old male with right upper quadrant abdominal
pain.

EXAM:
ULTRASOUND ABDOMEN LIMITED RIGHT UPPER QUADRANT

[Series 1: us abdomen limited · 13 of 28 slices shown]
[im 1/28]
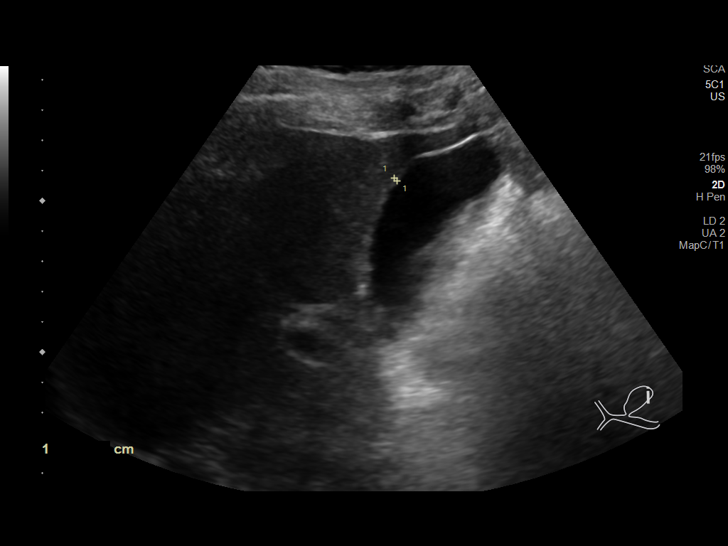
[im 3/28]
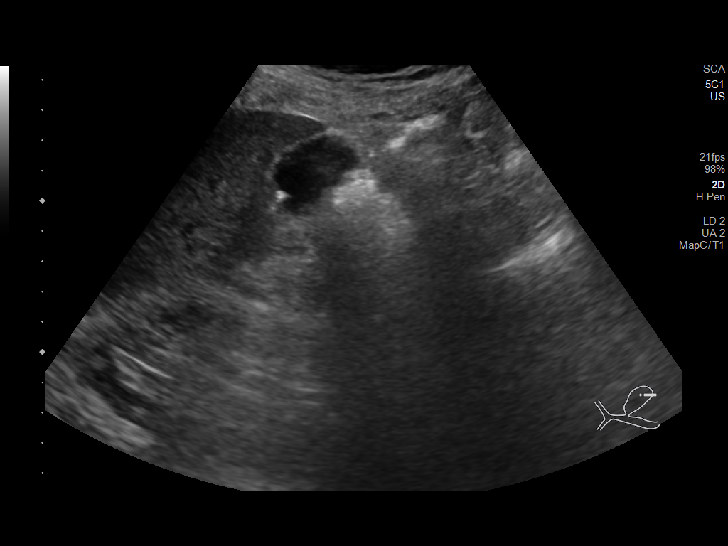
[im 5/28]
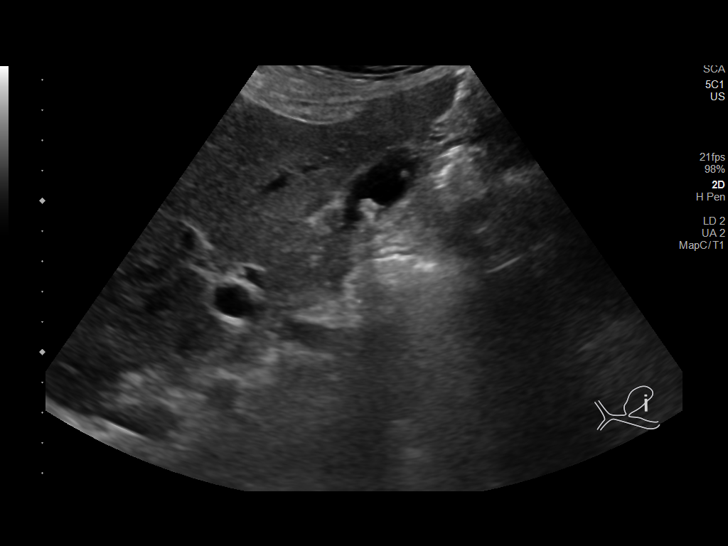
[im 7/28]
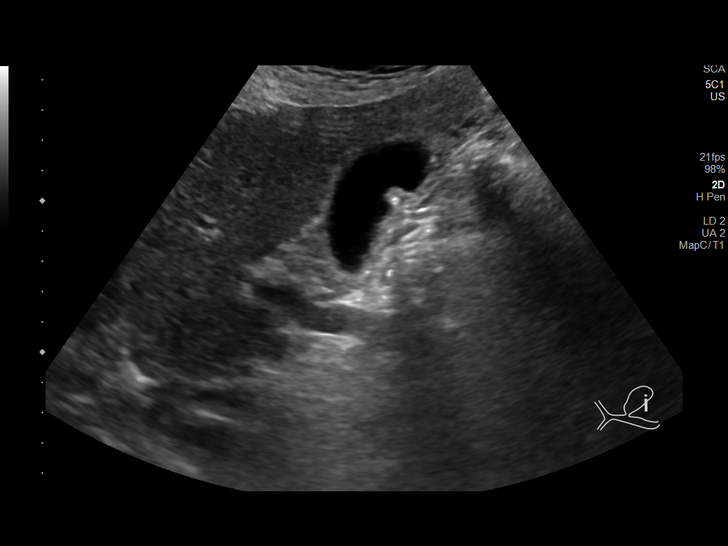
[im 10/28]
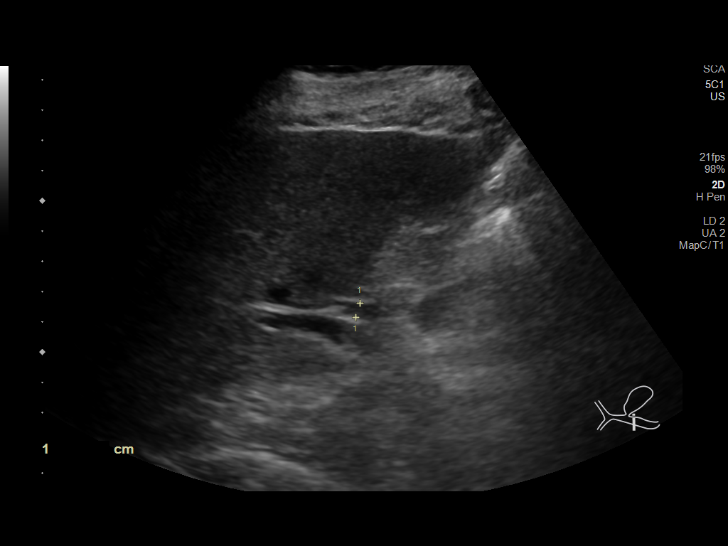
[im 12/28]
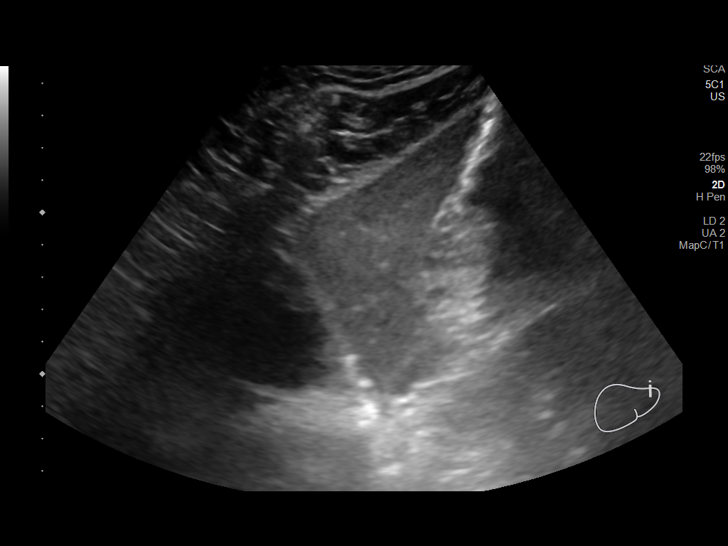
[im 14/28]
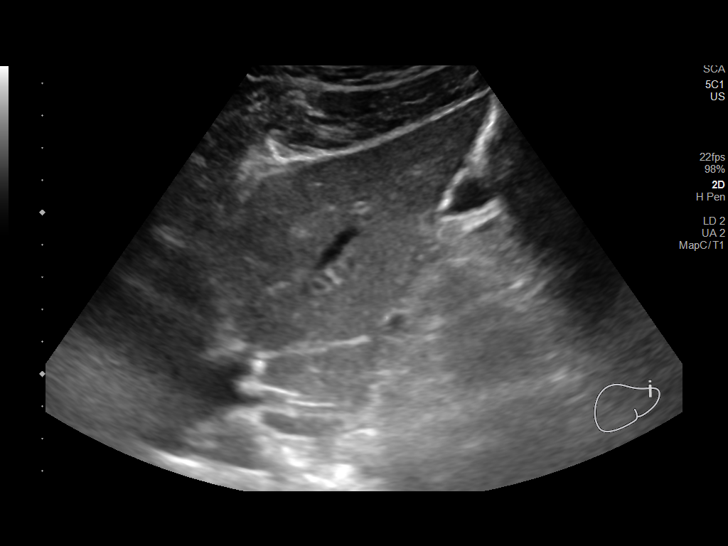
[im 16/28]
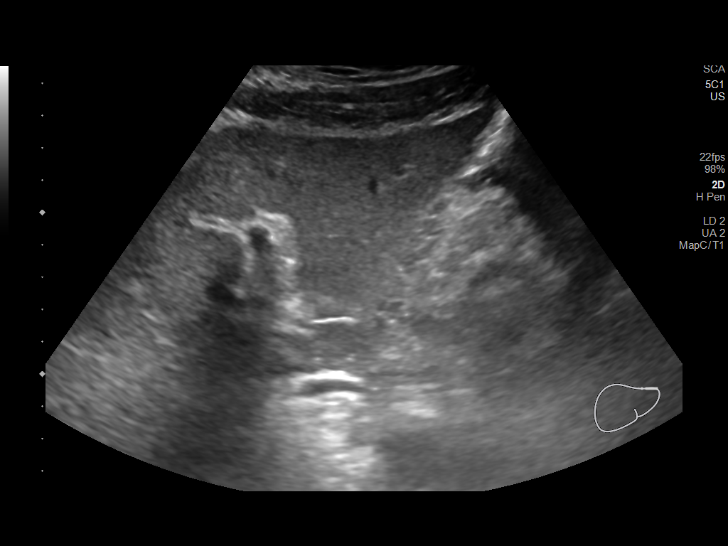
[im 19/28]
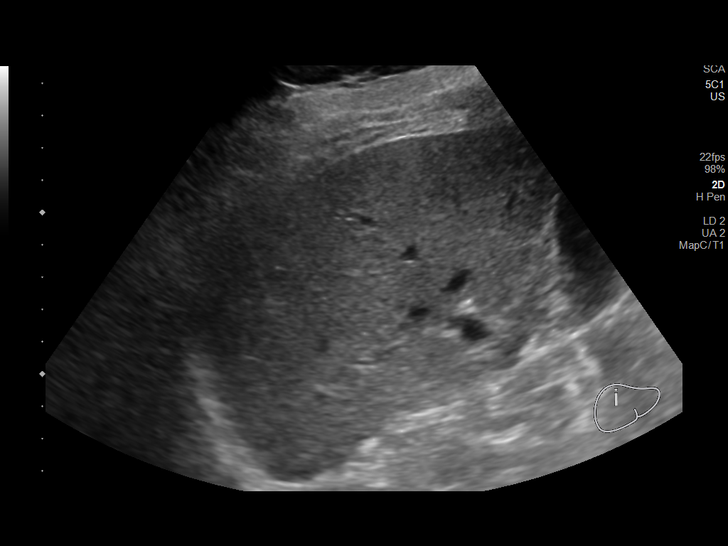
[im 21/28]
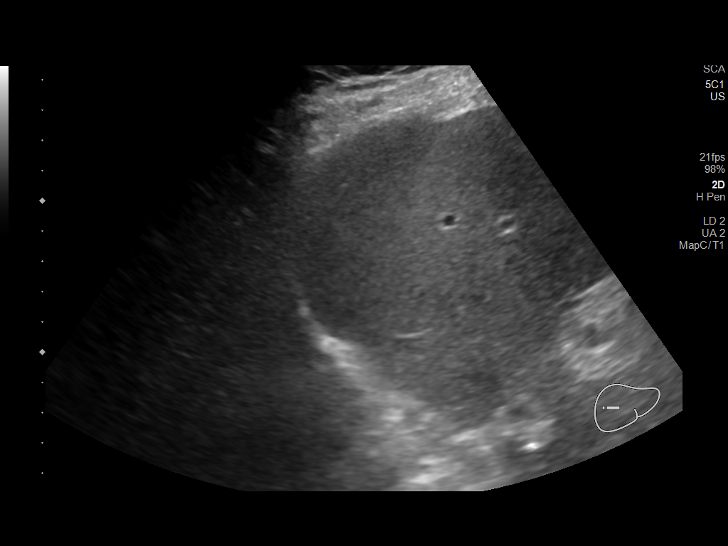
[im 23/28]
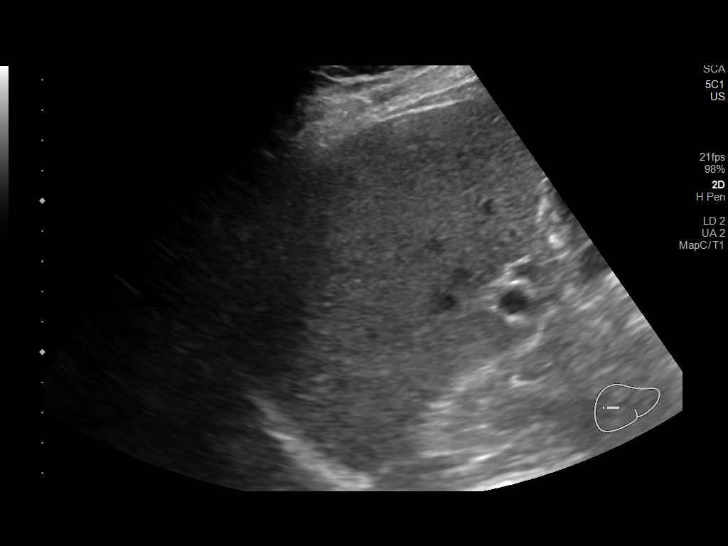
[im 25/28]
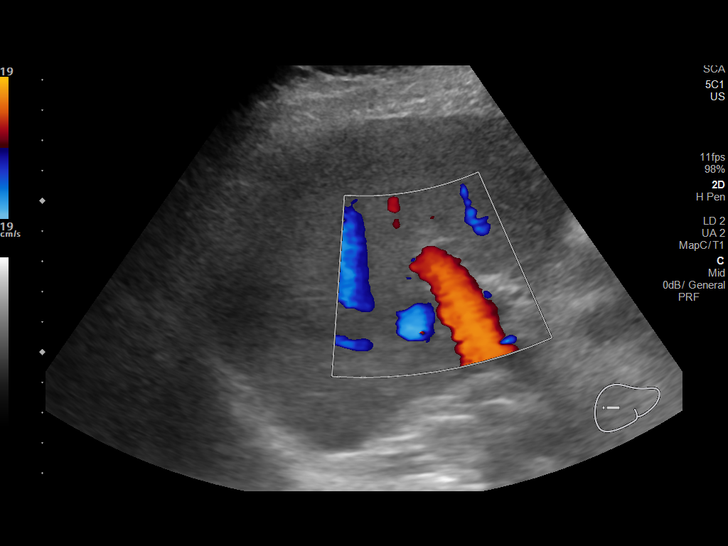
[im 28/28]
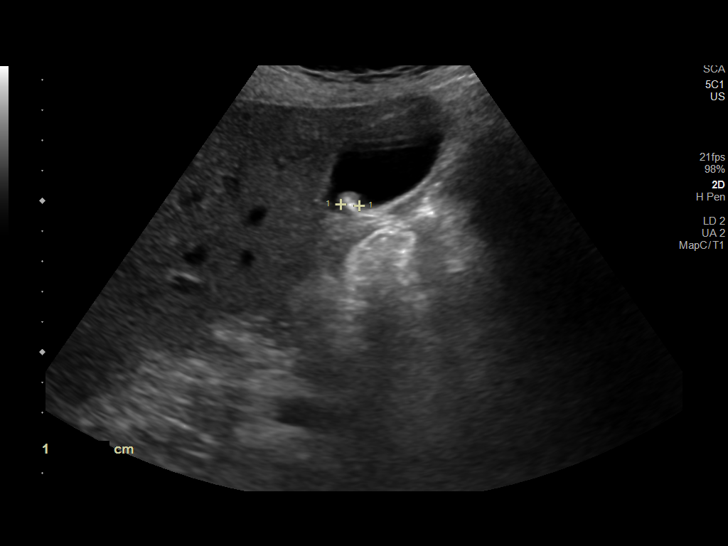

[13 of 25 positions shown; findings below may reference images not displayed]

FINDINGS: Gallbladder:

Small shadowing 9 mm gallstone identified on image 7. Additional
small nonshadowing stones adherent to the gallbladder wall versus
small echogenic polyps - the largest of which are also 8-9 mm. One
such polyp demonstrates internal vascularity on image 28. But
elsewhere gallbladder wall thickness is normal at 1 mm. No
pericholecystic fluid.

Common bile duct:

Diameter: 5 mm, normal.

Liver:

No focal lesion identified. Within normal limits in parenchymal
echogenicity. Portal vein is patent on color Doppler imaging with
normal direction of blood flow towards the liver.

Other: Negative visible right kidney.  No free fluid.
IMPRESSION: 1. Cholelithiasis without evidence of acute cholecystitis or bile
duct obstruction.

2. Superimposed gallbladder polyp(s), up to 9 mm in size. Yearly
follow-up with Ultrasound is recommended, with subsequent Surgery
consultation if any polyp enlarges.
This recommendation follows ACR consensus guidelines: White Paper of
the ACR Incidental Findings Committee II on Gallbladder and Biliary
Findings. [HOSPITAL] 5056:;[DATE].

3. Normal ultrasound appearance of the liver.

## 2020-10-18 DIAGNOSIS — J0110 Acute frontal sinusitis, unspecified: Secondary | ICD-10-CM | POA: Diagnosis not present

## 2020-10-18 DIAGNOSIS — J330 Polyp of nasal cavity: Secondary | ICD-10-CM | POA: Diagnosis not present

## 2020-11-16 DIAGNOSIS — D1801 Hemangioma of skin and subcutaneous tissue: Secondary | ICD-10-CM | POA: Diagnosis not present

## 2020-11-16 DIAGNOSIS — L570 Actinic keratosis: Secondary | ICD-10-CM | POA: Diagnosis not present

## 2020-11-16 DIAGNOSIS — L814 Other melanin hyperpigmentation: Secondary | ICD-10-CM | POA: Diagnosis not present

## 2020-11-16 DIAGNOSIS — L821 Other seborrheic keratosis: Secondary | ICD-10-CM | POA: Diagnosis not present

## 2021-01-17 DIAGNOSIS — I10 Essential (primary) hypertension: Secondary | ICD-10-CM | POA: Diagnosis not present

## 2021-01-17 DIAGNOSIS — Z23 Encounter for immunization: Secondary | ICD-10-CM | POA: Diagnosis not present

## 2021-01-19 DIAGNOSIS — E7800 Pure hypercholesterolemia, unspecified: Secondary | ICD-10-CM | POA: Diagnosis not present

## 2021-01-19 DIAGNOSIS — I10 Essential (primary) hypertension: Secondary | ICD-10-CM | POA: Diagnosis not present

## 2021-01-25 DIAGNOSIS — H25813 Combined forms of age-related cataract, bilateral: Secondary | ICD-10-CM | POA: Diagnosis not present

## 2021-01-25 DIAGNOSIS — H353131 Nonexudative age-related macular degeneration, bilateral, early dry stage: Secondary | ICD-10-CM | POA: Diagnosis not present

## 2021-01-25 DIAGNOSIS — H04123 Dry eye syndrome of bilateral lacrimal glands: Secondary | ICD-10-CM | POA: Diagnosis not present

## 2021-01-26 DIAGNOSIS — I499 Cardiac arrhythmia, unspecified: Secondary | ICD-10-CM | POA: Diagnosis not present

## 2021-01-26 DIAGNOSIS — I10 Essential (primary) hypertension: Secondary | ICD-10-CM | POA: Diagnosis not present

## 2021-03-22 DIAGNOSIS — J0110 Acute frontal sinusitis, unspecified: Secondary | ICD-10-CM | POA: Diagnosis not present

## 2021-03-22 DIAGNOSIS — Z87891 Personal history of nicotine dependence: Secondary | ICD-10-CM | POA: Diagnosis not present

## 2021-03-22 DIAGNOSIS — J330 Polyp of nasal cavity: Secondary | ICD-10-CM | POA: Diagnosis not present

## 2021-04-25 DIAGNOSIS — U071 COVID-19: Secondary | ICD-10-CM | POA: Diagnosis not present

## 2021-07-18 DIAGNOSIS — R7303 Prediabetes: Secondary | ICD-10-CM | POA: Diagnosis not present

## 2021-07-18 DIAGNOSIS — E7800 Pure hypercholesterolemia, unspecified: Secondary | ICD-10-CM | POA: Diagnosis not present

## 2021-07-18 DIAGNOSIS — I10 Essential (primary) hypertension: Secondary | ICD-10-CM | POA: Diagnosis not present

## 2021-07-25 DIAGNOSIS — Z0000 Encounter for general adult medical examination without abnormal findings: Secondary | ICD-10-CM | POA: Diagnosis not present

## 2021-07-25 DIAGNOSIS — I10 Essential (primary) hypertension: Secondary | ICD-10-CM | POA: Diagnosis not present

## 2021-07-25 DIAGNOSIS — E7800 Pure hypercholesterolemia, unspecified: Secondary | ICD-10-CM | POA: Diagnosis not present

## 2021-09-21 DIAGNOSIS — S00412A Abrasion of left ear, initial encounter: Secondary | ICD-10-CM | POA: Diagnosis not present

## 2021-11-14 DIAGNOSIS — L814 Other melanin hyperpigmentation: Secondary | ICD-10-CM | POA: Diagnosis not present

## 2021-11-14 DIAGNOSIS — L821 Other seborrheic keratosis: Secondary | ICD-10-CM | POA: Diagnosis not present

## 2021-11-14 DIAGNOSIS — L218 Other seborrheic dermatitis: Secondary | ICD-10-CM | POA: Diagnosis not present

## 2021-11-14 DIAGNOSIS — L570 Actinic keratosis: Secondary | ICD-10-CM | POA: Diagnosis not present

## 2022-01-10 DIAGNOSIS — J330 Polyp of nasal cavity: Secondary | ICD-10-CM | POA: Diagnosis not present

## 2022-01-10 DIAGNOSIS — R0981 Nasal congestion: Secondary | ICD-10-CM | POA: Diagnosis not present

## 2022-01-30 DIAGNOSIS — I10 Essential (primary) hypertension: Secondary | ICD-10-CM | POA: Diagnosis not present

## 2022-01-30 DIAGNOSIS — Z23 Encounter for immunization: Secondary | ICD-10-CM | POA: Diagnosis not present

## 2022-01-31 DIAGNOSIS — H11042 Peripheral pterygium, stationary, left eye: Secondary | ICD-10-CM | POA: Diagnosis not present

## 2022-01-31 DIAGNOSIS — H04123 Dry eye syndrome of bilateral lacrimal glands: Secondary | ICD-10-CM | POA: Diagnosis not present

## 2022-01-31 DIAGNOSIS — H25813 Combined forms of age-related cataract, bilateral: Secondary | ICD-10-CM | POA: Diagnosis not present

## 2022-01-31 DIAGNOSIS — H353131 Nonexudative age-related macular degeneration, bilateral, early dry stage: Secondary | ICD-10-CM | POA: Diagnosis not present

## 2022-04-10 DIAGNOSIS — J330 Polyp of nasal cavity: Secondary | ICD-10-CM | POA: Diagnosis not present

## 2022-04-10 DIAGNOSIS — R0981 Nasal congestion: Secondary | ICD-10-CM | POA: Diagnosis not present

## 2022-07-16 DIAGNOSIS — I10 Essential (primary) hypertension: Secondary | ICD-10-CM | POA: Diagnosis not present

## 2022-07-16 DIAGNOSIS — R062 Wheezing: Secondary | ICD-10-CM | POA: Diagnosis not present

## 2022-07-16 DIAGNOSIS — R059 Cough, unspecified: Secondary | ICD-10-CM | POA: Diagnosis not present

## 2022-07-16 DIAGNOSIS — Z20822 Contact with and (suspected) exposure to covid-19: Secondary | ICD-10-CM | POA: Diagnosis not present

## 2022-08-07 DIAGNOSIS — R7303 Prediabetes: Secondary | ICD-10-CM | POA: Diagnosis not present

## 2022-08-07 DIAGNOSIS — E7800 Pure hypercholesterolemia, unspecified: Secondary | ICD-10-CM | POA: Diagnosis not present

## 2022-08-07 DIAGNOSIS — Z0000 Encounter for general adult medical examination without abnormal findings: Secondary | ICD-10-CM | POA: Diagnosis not present

## 2022-08-07 DIAGNOSIS — I10 Essential (primary) hypertension: Secondary | ICD-10-CM | POA: Diagnosis not present

## 2022-11-18 DIAGNOSIS — D2271 Melanocytic nevi of right lower limb, including hip: Secondary | ICD-10-CM | POA: Diagnosis not present

## 2022-11-18 DIAGNOSIS — L570 Actinic keratosis: Secondary | ICD-10-CM | POA: Diagnosis not present

## 2022-11-18 DIAGNOSIS — L821 Other seborrheic keratosis: Secondary | ICD-10-CM | POA: Diagnosis not present

## 2022-11-18 DIAGNOSIS — D0462 Carcinoma in situ of skin of left upper limb, including shoulder: Secondary | ICD-10-CM | POA: Diagnosis not present

## 2022-11-18 DIAGNOSIS — L814 Other melanin hyperpigmentation: Secondary | ICD-10-CM | POA: Diagnosis not present

## 2023-01-13 DIAGNOSIS — H10013 Acute follicular conjunctivitis, bilateral: Secondary | ICD-10-CM | POA: Diagnosis not present

## 2023-01-13 DIAGNOSIS — H04123 Dry eye syndrome of bilateral lacrimal glands: Secondary | ICD-10-CM | POA: Diagnosis not present

## 2023-01-30 DIAGNOSIS — G4700 Insomnia, unspecified: Secondary | ICD-10-CM | POA: Diagnosis not present

## 2023-01-30 DIAGNOSIS — Z23 Encounter for immunization: Secondary | ICD-10-CM | POA: Diagnosis not present

## 2023-01-30 DIAGNOSIS — R7303 Prediabetes: Secondary | ICD-10-CM | POA: Diagnosis not present

## 2023-01-30 DIAGNOSIS — E7800 Pure hypercholesterolemia, unspecified: Secondary | ICD-10-CM | POA: Diagnosis not present

## 2023-01-30 DIAGNOSIS — I10 Essential (primary) hypertension: Secondary | ICD-10-CM | POA: Diagnosis not present

## 2023-02-28 DIAGNOSIS — G4700 Insomnia, unspecified: Secondary | ICD-10-CM | POA: Diagnosis not present

## 2023-02-28 DIAGNOSIS — I10 Essential (primary) hypertension: Secondary | ICD-10-CM | POA: Diagnosis not present

## 2023-03-19 DIAGNOSIS — H02055 Trichiasis without entropian left lower eyelid: Secondary | ICD-10-CM | POA: Diagnosis not present

## 2023-03-19 DIAGNOSIS — H04123 Dry eye syndrome of bilateral lacrimal glands: Secondary | ICD-10-CM | POA: Diagnosis not present

## 2023-03-19 DIAGNOSIS — H353131 Nonexudative age-related macular degeneration, bilateral, early dry stage: Secondary | ICD-10-CM | POA: Diagnosis not present

## 2023-03-19 DIAGNOSIS — H25813 Combined forms of age-related cataract, bilateral: Secondary | ICD-10-CM | POA: Diagnosis not present

## 2023-04-01 DIAGNOSIS — J339 Nasal polyp, unspecified: Secondary | ICD-10-CM | POA: Diagnosis not present

## 2023-05-23 DIAGNOSIS — H17821 Peripheral opacity of cornea, right eye: Secondary | ICD-10-CM | POA: Diagnosis not present

## 2023-05-23 DIAGNOSIS — H16402 Unspecified corneal neovascularization, left eye: Secondary | ICD-10-CM | POA: Diagnosis not present

## 2023-05-23 DIAGNOSIS — H04123 Dry eye syndrome of bilateral lacrimal glands: Secondary | ICD-10-CM | POA: Diagnosis not present

## 2023-05-23 DIAGNOSIS — H02055 Trichiasis without entropian left lower eyelid: Secondary | ICD-10-CM | POA: Diagnosis not present

## 2023-06-03 DIAGNOSIS — H179 Unspecified corneal scar and opacity: Secondary | ICD-10-CM | POA: Diagnosis not present

## 2023-06-03 DIAGNOSIS — H02055 Trichiasis without entropian left lower eyelid: Secondary | ICD-10-CM | POA: Diagnosis not present

## 2023-06-16 DIAGNOSIS — H0279 Other degenerative disorders of eyelid and periocular area: Secondary | ICD-10-CM | POA: Diagnosis not present

## 2023-06-16 DIAGNOSIS — H02535 Eyelid retraction left lower eyelid: Secondary | ICD-10-CM | POA: Diagnosis not present

## 2023-06-16 DIAGNOSIS — H02532 Eyelid retraction right lower eyelid: Secondary | ICD-10-CM | POA: Diagnosis not present

## 2023-06-16 DIAGNOSIS — H02055 Trichiasis without entropian left lower eyelid: Secondary | ICD-10-CM | POA: Diagnosis not present

## 2023-06-17 DIAGNOSIS — H6691 Otitis media, unspecified, right ear: Secondary | ICD-10-CM | POA: Diagnosis not present

## 2023-06-17 DIAGNOSIS — R059 Cough, unspecified: Secondary | ICD-10-CM | POA: Diagnosis not present

## 2023-06-23 ENCOUNTER — Other Ambulatory Visit: Payer: Self-pay | Admitting: Family Medicine

## 2023-06-23 DIAGNOSIS — Z87891 Personal history of nicotine dependence: Secondary | ICD-10-CM

## 2023-06-27 DIAGNOSIS — H16402 Unspecified corneal neovascularization, left eye: Secondary | ICD-10-CM | POA: Diagnosis not present

## 2023-07-02 ENCOUNTER — Other Ambulatory Visit: Payer: Self-pay | Admitting: Family Medicine

## 2023-07-02 DIAGNOSIS — Z87891 Personal history of nicotine dependence: Secondary | ICD-10-CM

## 2023-07-08 DIAGNOSIS — H02055 Trichiasis without entropian left lower eyelid: Secondary | ICD-10-CM | POA: Diagnosis not present

## 2023-07-08 DIAGNOSIS — S0502XA Injury of conjunctiva and corneal abrasion without foreign body, left eye, initial encounter: Secondary | ICD-10-CM | POA: Diagnosis not present

## 2023-07-10 DIAGNOSIS — S0502XA Injury of conjunctiva and corneal abrasion without foreign body, left eye, initial encounter: Secondary | ICD-10-CM | POA: Diagnosis not present

## 2023-07-10 DIAGNOSIS — H02055 Trichiasis without entropian left lower eyelid: Secondary | ICD-10-CM | POA: Diagnosis not present

## 2023-07-10 DIAGNOSIS — H16402 Unspecified corneal neovascularization, left eye: Secondary | ICD-10-CM | POA: Diagnosis not present

## 2023-07-21 DIAGNOSIS — H02055 Trichiasis without entropian left lower eyelid: Secondary | ICD-10-CM | POA: Diagnosis not present

## 2023-07-21 DIAGNOSIS — H16402 Unspecified corneal neovascularization, left eye: Secondary | ICD-10-CM | POA: Diagnosis not present

## 2023-07-21 DIAGNOSIS — S0502XA Injury of conjunctiva and corneal abrasion without foreign body, left eye, initial encounter: Secondary | ICD-10-CM | POA: Diagnosis not present

## 2023-07-21 DIAGNOSIS — H04123 Dry eye syndrome of bilateral lacrimal glands: Secondary | ICD-10-CM | POA: Diagnosis not present

## 2023-07-24 ENCOUNTER — Ambulatory Visit
Admission: RE | Admit: 2023-07-24 | Discharge: 2023-07-24 | Disposition: A | Discharge status: Home / Self Care | Payer: Medicare Other | Source: Ambulatory Visit | Attending: Family Medicine | Admitting: Family Medicine

## 2023-07-24 DIAGNOSIS — Z87891 Personal history of nicotine dependence: Secondary | ICD-10-CM | POA: Diagnosis not present

## 2023-07-24 DIAGNOSIS — I7121 Aneurysm of the ascending aorta, without rupture: Secondary | ICD-10-CM | POA: Diagnosis not present

## 2023-08-04 DIAGNOSIS — H02055 Trichiasis without entropian left lower eyelid: Secondary | ICD-10-CM | POA: Diagnosis not present

## 2023-08-04 DIAGNOSIS — H02535 Eyelid retraction left lower eyelid: Secondary | ICD-10-CM | POA: Diagnosis not present

## 2023-08-28 DIAGNOSIS — H02055 Trichiasis without entropian left lower eyelid: Secondary | ICD-10-CM | POA: Diagnosis not present

## 2023-08-28 DIAGNOSIS — H179 Unspecified corneal scar and opacity: Secondary | ICD-10-CM | POA: Diagnosis not present

## 2023-09-02 DIAGNOSIS — G4700 Insomnia, unspecified: Secondary | ICD-10-CM | POA: Diagnosis not present

## 2023-09-02 DIAGNOSIS — Z0000 Encounter for general adult medical examination without abnormal findings: Secondary | ICD-10-CM | POA: Diagnosis not present

## 2023-09-02 DIAGNOSIS — R7303 Prediabetes: Secondary | ICD-10-CM | POA: Diagnosis not present

## 2023-09-02 DIAGNOSIS — J302 Other seasonal allergic rhinitis: Secondary | ICD-10-CM | POA: Diagnosis not present

## 2023-09-02 DIAGNOSIS — I10 Essential (primary) hypertension: Secondary | ICD-10-CM | POA: Diagnosis not present

## 2023-09-02 DIAGNOSIS — E7800 Pure hypercholesterolemia, unspecified: Secondary | ICD-10-CM | POA: Diagnosis not present

## 2023-09-16 ENCOUNTER — Other Ambulatory Visit: Payer: Self-pay | Admitting: Family Medicine

## 2023-09-16 DIAGNOSIS — K8689 Other specified diseases of pancreas: Secondary | ICD-10-CM

## 2023-09-30 ENCOUNTER — Ambulatory Visit
Admission: RE | Admit: 2023-09-30 | Discharge: 2023-09-30 | Disposition: A | Discharge status: Home / Self Care | Source: Ambulatory Visit | Attending: Family Medicine | Admitting: Family Medicine

## 2023-09-30 DIAGNOSIS — K828 Other specified diseases of gallbladder: Secondary | ICD-10-CM | POA: Diagnosis not present

## 2023-09-30 DIAGNOSIS — K8689 Other specified diseases of pancreas: Secondary | ICD-10-CM

## 2023-09-30 DIAGNOSIS — K862 Cyst of pancreas: Secondary | ICD-10-CM | POA: Diagnosis not present

## 2023-09-30 MED ORDER — GADOPICLENOL 0.5 MMOL/ML IV SOLN
8.5000 mL | Freq: Once | INTRAVENOUS | Status: AC | PRN
  Administered 2023-09-30: 8.5 mL via INTRAVENOUS

## 2023-10-01 ENCOUNTER — Other Ambulatory Visit: Payer: Self-pay | Admitting: Family Medicine

## 2023-10-01 ENCOUNTER — Other Ambulatory Visit (HOSPITAL_COMMUNITY): Payer: Self-pay | Admitting: Family Medicine

## 2023-10-01 DIAGNOSIS — K869 Disease of pancreas, unspecified: Secondary | ICD-10-CM

## 2023-10-01 NOTE — Progress Notes (Signed)
 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

## 2023-10-29 ENCOUNTER — Other Ambulatory Visit: Payer: Self-pay | Admitting: Gastroenterology

## 2023-10-29 DIAGNOSIS — K869 Disease of pancreas, unspecified: Secondary | ICD-10-CM | POA: Diagnosis not present

## 2023-11-11 ENCOUNTER — Encounter (HOSPITAL_COMMUNITY): Payer: Self-pay | Admitting: Gastroenterology

## 2023-11-11 ENCOUNTER — Other Ambulatory Visit: Payer: Self-pay

## 2023-11-18 DIAGNOSIS — L814 Other melanin hyperpigmentation: Secondary | ICD-10-CM | POA: Diagnosis not present

## 2023-11-18 DIAGNOSIS — D2271 Melanocytic nevi of right lower limb, including hip: Secondary | ICD-10-CM | POA: Diagnosis not present

## 2023-11-18 DIAGNOSIS — D1801 Hemangioma of skin and subcutaneous tissue: Secondary | ICD-10-CM | POA: Diagnosis not present

## 2023-11-18 DIAGNOSIS — L570 Actinic keratosis: Secondary | ICD-10-CM | POA: Diagnosis not present

## 2023-11-18 DIAGNOSIS — D225 Melanocytic nevi of trunk: Secondary | ICD-10-CM | POA: Diagnosis not present

## 2023-11-18 DIAGNOSIS — L821 Other seborrheic keratosis: Secondary | ICD-10-CM | POA: Diagnosis not present

## 2023-11-18 NOTE — Anesthesia Preprocedure Evaluation (Signed)
 Anesthesia Evaluation  Patient identified by MRN, date of birth, ID band Patient awake    Reviewed: Allergy & Precautions, NPO status , Patient's Chart, lab work & pertinent test results  Airway Mallampati: II  TM Distance: >3 FB Neck ROM: Full    Dental no notable dental hx. (+) Teeth Intact, Dental Advisory Given   Pulmonary sleep apnea , former smoker   Pulmonary exam normal breath sounds clear to auscultation       Cardiovascular hypertension, Pt. on medications (-) angina (-) Past MI Normal cardiovascular exam Rhythm:Regular Rate:Normal     Neuro/Psych  Headaches  negative psych ROS   GI/Hepatic Neg liver ROS,,,Pancreatic mass   Endo/Other    Renal/GU      Musculoskeletal   Abdominal   Peds  Hematology   Anesthesia Other Findings   Reproductive/Obstetrics                              Anesthesia Physical Anesthesia Plan  ASA: 3  Anesthesia Plan: MAC   Post-op Pain Management:    Induction:   PONV Risk Score and Plan: Treatment may vary due to age or medical condition and Propofol  infusion  Airway Management Planned: Natural Airway, Nasal Cannula and Simple Face Mask  Additional Equipment: None  Intra-op Plan:   Post-operative Plan:   Informed Consent: I have reviewed the patients History and Physical, chart, labs and discussed the procedure including the risks, benefits and alternatives for the proposed anesthesia with the patient or authorized representative who has indicated his/her understanding and acceptance.     Dental advisory given  Plan Discussed with: CRNA and Surgeon  Anesthesia Plan Comments: (EUS for pancreatic mass)         Anesthesia Quick Evaluation

## 2023-11-19 ENCOUNTER — Other Ambulatory Visit: Payer: Self-pay

## 2023-11-19 ENCOUNTER — Ambulatory Visit (HOSPITAL_COMMUNITY)
Admission: RE | Admit: 2023-11-19 | Discharge: 2023-11-19 | Disposition: A | Discharge status: Home / Self Care | Attending: Gastroenterology | Admitting: Gastroenterology

## 2023-11-19 ENCOUNTER — Encounter (HOSPITAL_COMMUNITY)
Admission: RE | Disposition: A | Discharge status: Home / Self Care | Payer: Self-pay | Source: Home / Self Care | Attending: Gastroenterology

## 2023-11-19 ENCOUNTER — Ambulatory Visit (HOSPITAL_COMMUNITY): Admitting: Anesthesiology

## 2023-11-19 DIAGNOSIS — Z800 Family history of malignant neoplasm of digestive organs: Secondary | ICD-10-CM | POA: Insufficient documentation

## 2023-11-19 DIAGNOSIS — Z79899 Other long term (current) drug therapy: Secondary | ICD-10-CM | POA: Insufficient documentation

## 2023-11-19 DIAGNOSIS — K862 Cyst of pancreas: Secondary | ICD-10-CM

## 2023-11-19 DIAGNOSIS — I10 Essential (primary) hypertension: Secondary | ICD-10-CM | POA: Insufficient documentation

## 2023-11-19 DIAGNOSIS — G4730 Sleep apnea, unspecified: Secondary | ICD-10-CM

## 2023-11-19 DIAGNOSIS — R932 Abnormal findings on diagnostic imaging of liver and biliary tract: Secondary | ICD-10-CM | POA: Diagnosis not present

## 2023-11-19 DIAGNOSIS — Z87891 Personal history of nicotine dependence: Secondary | ICD-10-CM

## 2023-11-19 DIAGNOSIS — K861 Other chronic pancreatitis: Secondary | ICD-10-CM | POA: Diagnosis not present

## 2023-11-19 HISTORY — PX: FINE NEEDLE ASPIRATION: SHX6590

## 2023-11-19 HISTORY — PX: EUS: SHX5427

## 2023-11-19 HISTORY — PX: ESOPHAGOGASTRODUODENOSCOPY: SHX5428

## 2023-11-19 SURGERY — EGD (ESOPHAGOGASTRODUODENOSCOPY)
Anesthesia: Monitor Anesthesia Care

## 2023-11-19 MED ORDER — CIPROFLOXACIN HCL 500 MG PO TABS: 500.0000 mg | ORAL_TABLET | Freq: Two times a day (BID) | ORAL | 0 refills | Status: AC

## 2023-11-19 MED ORDER — PROPOFOL 500 MG/50ML IV EMUL
INTRAVENOUS | Status: DC | PRN
Start: 2023-11-19 — End: 2023-11-19
  Administered 2023-11-19: 70 ug/kg/min via INTRAVENOUS
  Administered 2023-11-19: 80 mg via INTRAVENOUS

## 2023-11-19 MED ORDER — SODIUM CHLORIDE 0.9 % IV SOLN: INTRAVENOUS | Status: DC

## 2023-11-19 MED ORDER — CIPROFLOXACIN IN D5W 400 MG/200ML IV SOLN
INTRAVENOUS | Status: DC | PRN
  Administered 2023-11-19: 400 mg via INTRAVENOUS

## 2023-11-19 MED ORDER — CIPROFLOXACIN IN D5W 400 MG/200ML IV SOLN
INTRAVENOUS | Status: AC
  Filled 2023-11-19: qty 200

## 2023-11-19 NOTE — Anesthesia Postprocedure Evaluation (Signed)
 Anesthesia Post Note  Patient: Blake Ward  Procedure(s) Performed: ULTRASOUND, UPPER GI TRACT, ENDOSCOPIC EGD (ESOPHAGOGASTRODUODENOSCOPY) FINE NEEDLE ASPIRATION     Patient location during evaluation: Endoscopy Anesthesia Type: MAC Level of consciousness: awake and alert Pain management: pain level controlled Vital Signs Assessment: post-procedure vital signs reviewed and stable Respiratory status: spontaneous breathing, nonlabored ventilation, respiratory function stable and patient connected to nasal cannula oxygen Cardiovascular status: blood pressure returned to baseline and stable Postop Assessment: no apparent nausea or vomiting Anesthetic complications: no   No notable events documented.  Last Vitals:  Vitals:   11/19/23 1040 11/19/23 1050  BP: (!) 147/95 (!) 171/86  Pulse: 70 69  Resp: 16 10  Temp:    SpO2: 97% 98%    Last Pain:  Vitals:   11/19/23 1050  TempSrc:   PainSc: 0-No pain                 Blake Ward

## 2023-11-19 NOTE — Transfer of Care (Signed)
 Immediate Anesthesia Transfer of Care Note  Patient: Blake Ward  Procedure(s) Performed: ULTRASOUND, UPPER GI TRACT, ENDOSCOPIC EGD (ESOPHAGOGASTRODUODENOSCOPY) FINE NEEDLE ASPIRATION  Patient Location: PACU  Anesthesia Type:MAC  Level of Consciousness: awake, alert , and oriented  Airway & Oxygen Therapy: Patient Spontanous Breathing  Post-op Assessment: Report given to RN and Post -op Vital signs reviewed and stable  Post vital signs: Reviewed and stable  Last Vitals:  Vitals Value Taken Time  BP 147/95 11/19/23 10:40  Temp 36.5 C 11/19/23 10:36  Pulse 73 11/19/23 10:47  Resp 9 11/19/23 10:47  SpO2 99 % 11/19/23 10:47  Vitals shown include unfiled device data.  Last Pain:  Vitals:   11/19/23 1040  TempSrc:   PainSc: 0-No pain         Complications: No notable events documented.

## 2023-11-19 NOTE — Discharge Instructions (Signed)

## 2023-11-19 NOTE — H&P (Signed)
 [facility] H/P Note  Chief Complaint: pancreatic cyst  HPI: Blake Ward is an 73 y.o. male.  Large cyst pancreatic tail seen on recent CT (for lung cancer screening), leading to corroborative findings on MRI.  He has no prior personal or family history of pancreatitis or pancreatic cancer.  Brother with cholangiocarcinoma.  Patient has no abdominal pain, change in bowel habits, blood in stool, unintentional weight loss.  Past Medical History:  Diagnosis Date   Anxiety    No longer have anxiety, no longer on medicine   Environmental allergies    Headache    History of kidney stones    History of nasal polyp    Hypertension    PONV (postoperative nausea and vomiting)    Prostate cancer (HCC)    Sleep apnea    self diagnosis, has to sleep on side because he feels his jaw obstructs his airway when he falls asleep on his back    Past Surgical History:  Procedure Laterality Date   CHOLECYSTECTOMY N/A 02/22/2020   Procedure: LAPAROSCOPIC CHOLECYSTECTOMY;  Surgeon: Signe Mitzie LABOR, MD;  Location: [facility];  Service: General;  Laterality: N/A;   COLONOSCOPY     CYSTOSCOPY N/A 09/22/2017   Procedure: PHYLLIS SIDE;  Surgeon: Chauncey Redell Agent, MD;  Location: [facility];  Service: Urology;  Laterality: N/A;  no seeds found in bladder   HERNIA REPAIR  1969   [facility]    NASAL SINUS SURGERY  1982, 1988, 1998, 2019   Polypectomy   PROSTATE BIOPSY     RADIOACTIVE SEED IMPLANT N/A 09/22/2017   Procedure: RADIOACTIVE SEED IMPLANT/BRACHYTHERAPY IMPLANT;  Surgeon: Chauncey Redell Agent, MD;  Location: [facility];  Service: Urology;  Laterality: N/A;    67   seeds implanted   SPACE OAR INSTILLATION N/A 09/22/2017   Procedure: SPACE OAR INSTILLATION;  Surgeon: Chauncey Redell Agent, MD;  Location: [facility];  Service: Urology;  Laterality: N/A;   UMBILICAL HERNIA REPAIR N/A 02/22/2020   Procedure: HERNIA REPAIR UMBILICAL;  Surgeon:  Signe Mitzie LABOR, MD;  Location: [facility];  Service: General;  Laterality: N/A;   UPPER GI ENDOSCOPY     WISDOM TOOTH EXTRACTION      Medications Prior to Admission  Medication Sig Dispense Refill   budesonide (PULMICORT) 0.5 MG/2ML nebulizer solution Take 0.5 mg by nebulization See admin instructions. Add 1 vial to 240 ml of saline in rinse bottle. Irrigate sinuses with 120 ml through each nostril 2 times daily.     hydrochlorothiazide (HYDRODIURIL) 25 MG tablet Take 25 mg by mouth daily.      irbesartan (AVAPRO) 150 MG tablet Take 150 mg by mouth daily.   1   Propylene Glycol (SYSTANE BALANCE OP) Apply 1 drop to eye daily as needed (dry eyes).      Allergies: No Known Allergies  Family History  Problem Relation Age of Onset   Cancer Brother        colon cancer    Social History:  reports that he quit smoking about 27 years ago. His smoking use included cigarettes. He started smoking about 42 years ago. He has a 15 pack-year smoking history. He has never used smokeless tobacco. He reports that he does not drink alcohol and does not use drugs.   ROS: As per HPI, all others negative   Blood pressure (!) 180/89, pulse 79, temperature 98.6 F (37 C), temperature source Temporal, resp. rate 12, height 6' (1.829 m), weight  87.1 kg, SpO2 100%. General appearance: NAD HEENT:  [place]/AT, anicteric NECK:  Thin, supple CV:  Regular LUNGS:  No visible distress ABD:  Soft, non-tender NEURO:  No encephalopathy  No results found for this or any previous visit (from the past 48 hours). No results found.  Assessment/Plan   Pancreatic cyst, tail. Upper endoscopic ultrasound with possible cyst aspiration, possible fine needle aspiration (cytology). Risks (bleeding, infection, bowel perforation that could require surgery, sedation-related changes in cardiopulmonary systems), benefits (identification and possible treatment of source of symptoms, exclusion of certain causes of symptoms), and  alternatives (watchful waiting, radiographic imaging studies, empiric medical treatment) of upper endoscopy with ultrasound and possible fine needle aspiration (EUS +/- FNA) were explained to patient/family in detail and patient wishes to proceed.   [name] 11/19/2023, 10:01 AM

## 2023-11-19 NOTE — Op Note (Signed)
 [facility] Patient Name: Blake Ward Procedure Date: 11/19/2023 MRN: [id number] Attending MD: Elsie Cree , MD, [id number] Date of Birth: 29-Nov-1950 CSN: [id number] Age: 73 Admit Type: Outpatient Procedure:                Upper EUS Indications:              Pancreatic cyst on MRCP Providers:                Elsie Cree, MD, Randall Lines, RN, Corky Czech, Technician Referring MD:             Prentice Batch, MD Medicines:                Monitored Anesthesia Care, Cipro  400 mg IV Complications:            No immediate complications. Estimated Blood Loss:     Estimated blood loss was minimal. Procedure:                Pre-Anesthesia Assessment:                           - Prior to the procedure, a History and Physical                            was performed, and patient medications and                            allergies were reviewed. The patient's tolerance of                            previous anesthesia was also reviewed. The risks                            and benefits of the procedure and the sedation                            options and risks were discussed with the patient.                            All questions were answered, and informed consent                            was obtained. Prior Anticoagulants: The patient has                            taken no anticoagulant or antiplatelet agents. ASA                            Grade Assessment: III - A patient with severe                            systemic disease. After reviewing the risks and  benefits, the patient was deemed in satisfactory                            condition to undergo the procedure.                           After obtaining informed consent, the endoscope was                            passed under direct vision. Throughout the                            procedure, the patient's blood pressure, pulse, and                             oxygen saturations were monitored continuously. The                            [id number] ([id number] Olympus linear ultrasound scope                            was introduced through the mouth, and advanced to                            the antrum of the stomach. The upper EUS was                            accomplished without difficulty. The patient                            tolerated the procedure well. Scope In: Scope Out: Findings:      ENDOSONOGRAPHIC FINDING: :      An anechoic, hypoechoic, multicystic and septated lesion suggestive of a       cyst was identified in the pancreatic tail. It is not in obvious       communication with the pancreatic duct. The cystic lesion measured 60 mm       by 60 mm in maximal cross-sectional diameter. There were a few       compartments thickly septated. The outer wall of the lesion was thin.       There was an associated mural nodule in the wall, about 14 x 18 mm in       diameter. There was internal debris within the fluid-filled cavity. Fine       needle aspiration for cytology was performed. Color Doppler imaging was       utilized prior to needle puncture to confirm a lack of significant       vascular structures within the needle path. One pass was made with the       22 gauge needle using a transduodenal approach. A stylet was used. A       preliminary cytologic examination was not performed. Separate pass made       for aspiration of cyst fluid; about 5 mL of thick off-white fluid was       obtained. Final cytology results are pending.      No lymphadenopathy seen. Impression:               -  A complex cystic lesion was seen in the                            pancreatic tail. Fine needle aspiration performed                            both for cyst fluid as well (separately) of mural                            nodule. High suspicion for at least an aggressive                            pre-cancerous cystic lesion. Moderate  Sedation:      Not Applicable - Patient had care per Anesthesia. Recommendation:           - Discharge patient to home (via wheelchair).                           - Soft diet today.                           - Continue present medications.                           - Cipro  (ciprofloxacin ) 500 mg PO BID for 5 days.                           - Await cytology results and await tumor markers.                           - Refer to a pancreatic surgeon at appointment to                            be scheduled. Procedure Code(s):        --- Professional ---                           [phone], Esophagogastroduodenoscopy, flexible,                            transoral; with transendoscopic ultrasound-guided                            intramural or transmural fine needle                            aspiration/biopsy(s) (includes endoscopic                            ultrasound examination of the esophagus, stomach,                            and either the duodenum or a surgically altered                            stomach  where the jejunum is examined distal to the                            anastomosis) Diagnosis Code(s):        --- Professional ---                           K86.2, Cyst of pancreas CPT copyright 2022 American [employer] Association. All rights reserved. The codes documented in this report are preliminary and upon coder review may  be revised to meet current compliance requirements. Elsie Cree, MD 11/19/2023 10:43:21 AM This report has been signed electronically. Number of Addenda: 0

## 2023-11-20 ENCOUNTER — Encounter (HOSPITAL_COMMUNITY): Payer: Self-pay | Admitting: Gastroenterology

## 2023-11-21 ENCOUNTER — Encounter (HOSPITAL_COMMUNITY): Payer: Self-pay

## 2023-11-21 LAB — CYTOLOGY - NON PAP

## 2023-12-02 ENCOUNTER — Other Ambulatory Visit: Payer: Self-pay | Admitting: General Surgery

## 2023-12-02 DIAGNOSIS — K8689 Other specified diseases of pancreas: Secondary | ICD-10-CM

## 2023-12-02 DIAGNOSIS — K862 Cyst of pancreas: Secondary | ICD-10-CM | POA: Diagnosis not present

## 2023-12-02 NOTE — Progress Notes (Addendum)
 Chief Complaint  Patient presents with  . New Consultation    malignant appearing pancreatic cyst.     Referring MD: Outlaw  HISTORY:  Patient presents for referral by Dr. Burnette for a newly diagnosed pancreatic cystic mass.  The patient had this incidentally discovered when he underwent a screening chest CT for high risk lung cancer screening.  He has a smoking history although he has not smoked for 30 years and had a lingering cough.  He was seen to have a complex cystic mass in the tail of the pancreas and MRI was recommended.  MR abdomen was then performed and that showed a 7.2 cm complex rim-enhancing cystic lesion in the distal pancreatic tail.  There were contrast-enhancing internal septations and mural nodules.  He saw Dr. Burnette who performed an endoscopic ultrasound.  This confirmed these findings.  One of the mural nodules was 18 mm in greatest dimension.  There also was debris in one of the cavities.  A FNA was performed which was benign.  I cannot tell if any fluid was sent.  The patient has no abdominal symptoms.  He denies any weight loss or abdominal pain.  He denies any diagnosis of diabetes.  He has not had any diarrhea.  He is able to play golf.  He has good exercise tolerance with this.  He no longer drinks any alcohol  and has not for over 20 years.  He is a prostate cancer survivor.  He is accompanied by his wife.  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 *Calcifications of the proximal ascending aorta aortic valve and coronary artery calcifications. *Complex lobular cystic appearing mass within the region of the tail of the pancreas measuring 6.7 x 5.4 x 4.2 cm in the AP, transverse and longitudinal diameter with high attenuation changes along the inferior margin. 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 Clinical correlation with prior medical history of pancreatitis AP helpful. *Calcifications in the left kidney upper midpole calices consistent with nephrolithiasis without hydronephrosis. *Aorta recommendation: Recommend followup by ultrasound in 1 year.    MR abdomen 09/30/23  IMPRESSION: 1. Complex rim enhancing cystic lesion arising from the distal pancreatic tail measuring 7.2 x 6.2 cm. This contains complex, contrast enhancing internal septation and nodularity, most concentrated in the dorsal aspect of the lesion. 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 Consider tissue sampling. 2. No evidence of lymphadenopathy or metastatic disease in the abdomen. 3. Contracted gallbladder or gallbladder remnant.  No gallstones.  These results will be called to the ordering clinician or representative by the Radiologist Assistant, and communication documented in the PACS or [employer].  Aortic Atherosclerosis (ICD10-I70.0).   EUS: 11/19/23 Outlaw An anechoic, hypoechoic, multicystic and septated lesion suggestive of a cyst was identified in the pancreatic tail. It is not in obvious communication with the pancreatic duct. The cystic lesion measured 60 mm by 60 mm in maximal cross- sectional diameter. There were a few compartments thickly septated. The outer wall of the lesion was thin. There was an associated mural nodule in the wall, about 14 x 18 mm in diameter. There was internal debris within the fluid- filled cavity. Fine needle aspiration for cytology was performed. Color Doppler imaging was utilized prior to needle puncture to confirm a lack of significant vascular structures within  the needle path. One pass was made with the 22 gauge needle using a transduodenal approach. A stylet was used. A preliminary cytologic examination was not performed. Separate pass made for aspiration of cyst fluid; about 5 mL of thick off- white  fluid was obtained. Final cytology results are pending.  Cytology 11/19/23 A. PANCREAS TAIL, CYST, FINE NEEDLE ASPIRATION:  - No malignant cells identified  - Inflammation and cyst contents  B. PANCREAS TAIL, MURAL NODULE, FINE NEEDLE ASPIRATION:  - No malignant cells identified  - Benign reactive/reparative changes     Past Medical History:  Diagnosis Date  . Allergic rhinitis due to allergen   . History of cataract   . Hypertension   . Malignant neoplasm of prostate (CMS/HHS-HCC) 05/08/2017  . Prostate cancer (CMS/HHS-HCC)   . Sleep apnea   . Vision abnormalities     Past Surgical History:  Procedure Laterality Date  . CORNEAL EYE SURGERY Left 11/26/2017   Pterygium excision w/Dr. Gifford   . CHOLECYSTECTOMY    . EXCISION NASAL POLYPS    . prostate surgery      Current Outpatient Medications  Medication Sig Dispense Refill  . albuterol MDI, PROVENTIL, VENTOLIN, PROAIR, HFA 90 mcg/actuation inhaler Inhale 1 Puff into the lungs every 4 (four) hours as needed    . albuterol MDI, PROVENTIL, VENTOLIN, PROAIR, HFA 90 mcg/actuation inhaler 2 puffs as needed for wheezing or shortness of breath Inhalation every 6 hours; Duration: 30 days    . albuterol-budesonide 90-80 mcg/actuation HFAA     . amLODIPine  (NORVASC ) 2.5 MG tablet Take 2.5 mg by mouth once daily    . amLODIPine  (NORVASC ) 2.5 MG tablet Take 2.5 mg by mouth once daily    . amLODIPine  (NORVASC ) 5 MG tablet Take 5 mg by mouth once daily    . Bacillus subtilis-inulin 1.5 billion cell-1 gram Chew Take by mouth as directed    . lactobacillus comb no.10 (PROBIOTIC) 20 billion cell Cap Take by mouth once daily    . loratadine  (CLARITIN ) 10 mg tablet 1 tablet Orally Once a day    . magnesium  glycinate 118 mg magnesium  Cap Take 200 mg by mouth once daily    . montelukast  (SINGULAIR ) 10 mg tablet Take 1 tablet by mouth at bedtime    . multivitamin with minerals (MULTI-VIT 55 PLUS ORAL)     . multivitamin-lutein  (MULTIVITAMIN 50 PLUS) tablet Take 1 tablet by mouth once daily    . ascorbic acid, vitamin C, (VITAMIN C) 500 MG tablet Take by mouth    . aspirin 81 MG EC tablet Take by mouth    . atenolol (TENORMIN) 25 MG tablet Take by mouth    . b complex vitamins capsule Take by mouth    . cetirizine (ZYRTEC) 10 MG tablet Take 10 mg by mouth once daily    . DULoxetine (CYMBALTA) 30 MG DR capsule Take by mouth    . fluticasone  propionate (FLONASE ) 50 mcg/actuation nasal spray by Nasal route    . hydroCHLOROthiazide (HYDRODIURIL) 25 MG tablet Take by mouth    . irbesartan  (AVAPRO ) 150 MG tablet Take by mouth    . moxifloxacin (VIGAMOX) 0.5 % ophthalmic solution 1 drop in the left eye 4 times a day. Begin 1 day prior to surgery and continue for 1 week after surgery (Patient not taking: Reported on 02/11/2018 ) 3 mL 0  . prednisoLONE acetate (PRED FORTE) 1 % ophthalmic suspension Place 1 drop into the left eye 4 (four) times daily    .  tamsulosin  (FLOMAX ) 0.4 mg capsule Take 0.4 mg by mouth once daily Take 30 minutes after same meal each day.    . traZODone (DESYREL) 50 MG tablet Take 50 mg by mouth at bedtime    . zinc gluconate 50 mg tablet Take by mouth     No current facility-administered medications for this visit.     No Known Allergies   Family History  Problem Relation Name Age of Onset  . High blood pressure (Hypertension) Mother    . Diabetes type I Mother    . Coronary Artery Disease (Blocked arteries around heart) Father    . Sleep apnea Father       Social History   Socioeconomic History  . Marital status: Married  Tobacco Use  . Smoking status: Former  . Smokeless tobacco: Never  Vaping Use  . Vaping status: Unknown  Substance and Sexual Activity  . Alcohol  use: Never  . Drug use: Never     REVIEW OF SYSTEMS - PERTINENT POSITIVES ONLY: A complete review of systems negative other than HPI and PMH except for nasal congestion  EXAM: Vitals:   12/02/23 1458  BP: (!)  154/81  Pulse: 94  Temp: 37.3 C (99.1 F)    Wt Readings from Last 3 Encounters:  12/02/23 86 kg (189 lb 9.6 oz)     Gen:  No acute distress.  Well nourished and well groomed.   Neurological: Alert and oriented to person, place, and time. Coordination normal.  Head: Normocephalic and atraumatic.  Eyes: Conjunctivae are normal. Pupils are equal, round, and reactive to light. No scleral icterus.  Neck: Normal range of motion. Neck supple. No tracheal deviation or thyromegaly present.  Cardiovascular: Normal rate, regular rhythm, normal heart sounds and intact distal pulses.  Exam reveals no gallop and no friction rub.  No murmur heard. Respiratory: Effort normal.  No respiratory distress. No chest wall tenderness. Breath sounds normal.  No wheezes, rales or rhonchi.  GI: Soft. Bowel sounds are normal. The abdomen is soft and nontender.  There is no rebound and no guarding.  Musculoskeletal: Normal range of motion. Extremities are nontender.  Lymphadenopathy: No cervical, preauricular, postauricular or axillary adenopathy is present Skin: Skin is warm and dry. No rash noted. No diaphoresis. No erythema. No pallor. No clubbing, cyanosis, or edema.   Psychiatric: Normal mood and affect. Behavior is normal. Judgment and thought content normal.    LABORATORY RESULTS: Available labs are reviewed   Labs from [facility] April 2025 normal complete metabolic panel other than a T. bili of 1.1.  CBC normal at that time.  Labs from June 2025 show normal c-Met other than glucose of 101.  CA 19-9 of 28 CEA of 2.2.  RADIOLOGY RESULTS: See above.     ASSESSMENT AND PLAN: Cystic mass of pancreas (HHS-HCC)  (primary encounter diagnosis)  History of prostate cancer  Patient has a cystic pancreatic mass that meets all of the concerning high risk feature categories.  I definitely recommend removal.  I discussed with the patient and his wife that surveillance would not be recommended because every time  we would repeat a scan we would have exactly the same decision points and conversation.  I discussed that the risk would be that he would be less healthy every time that he would get repeat scan.  I also discussed that with these high risk features there is an increased risk of cancer compared to other cystic pancreatic masses.  Recommend a distal pancreatectomy with  splenectomy.  I reviewed that I could do this with the laparoscopic or robotic approach.  I discussed the surgery along with the risks.  I reviewed risk of bleeding, infection, damage to adjacent structures, heart or lung complications, blood clot, death.  I specifically addressed risk of pancreatic endocrine exocrine insufficiency.  I reviewed what can happen with pancreatic leak.  I discussed risk of prolonged drain placement.  I discussed risk of additional procedures.  I reviewed anatomic diagrams and to the course of surgery.  I reviewed hospital stay, discharge requirements, and postoperative restrictions and expectations.  I discussed recovery timeframe.  [facility] that [facility].  I gave him a vaccine prescription.  He has already had a Pneumovax.  He is going to get the haemophilus influenza and Neisseria meningitidis vaccines.  We will do this at the first mutually available opportunity.    Jina LITTIE Nephew, MD FACS Surgical Oncology, General Surgery, Trauma and Critical [facility] Surgery, [place] [facility]

## 2023-12-23 NOTE — Progress Notes (Signed)
 Surgical Instructions   Your procedure is scheduled on Tuesday, August 26th, 2025. Report to [facility] Main Entrance A at 6:30 A.M., then check in with the Admitting office. Any questions or running late day of surgery: call [phone]  Questions prior to your surgery date: call [phone], Monday-Friday, 8am-4pm. If you experience any cold or flu symptoms such as cough, fever, chills, shortness of breath, etc. between now and your scheduled surgery, please notify us  at the above number.     Remember:  Do not eat after midnight the night before your surgery   You may drink clear liquids until 5:30 the morning of your surgery.   Clear liquids allowed are: Water, Non-Citrus Juices (without pulp), Carbonated Beverages, Clear Tea (no milk, honey, etc.), Black Coffee Only (NO MILK, CREAM OR POWDERED CREAMER of any kind), and Gatorade.    Take these medicines the morning of surgery with A SIP OF WATER: Amlodipine (Norvasc) Fluticasone (Flonase)   May take these medicines IF NEEDED: Cetirizine (Zyrtec) Eye drops   One week prior to surgery, STOP taking any Aspirin (unless otherwise instructed by your surgeon) Aleve, Naproxen, Ibuprofen, Motrin, Advil, Goody's, BC's, all herbal medications, fish oil, and non-prescription vitamins.                     Do NOT Smoke (Tobacco/Vaping) for 24 hours prior to your procedure.  If you use a CPAP at night, you may bring your mask/headgear for your overnight stay.   You will be asked to remove any contacts, glasses, piercing's, hearing aid's, dentures/partials prior to surgery. Please bring cases for these items if needed.    Patients discharged the day of surgery will not be allowed to drive home, and someone needs to stay with them for 24 hours.  SURGICAL WAITING ROOM VISITATION Patients may have no more than 2 support people in the waiting area - these visitors may rotate.   Pre-op nurse will coordinate an appropriate time for 1 ADULT  support person, who may not rotate, to accompany patient in pre-op.  Children under the age of 15 must have an adult with them who is not the patient and must remain in the main waiting area with an adult.  If the patient needs to stay at the hospital during part of their recovery, the visitor guidelines for inpatient rooms apply.  Please refer to the [facility] website for the visitor guidelines for any additional information.   If you received a COVID test during your pre-op visit  it is requested that you wear a mask when out in public, stay away from anyone that may not be feeling well and notify your surgeon if you develop symptoms. If you have been in contact with anyone that has tested positive in the last 10 days please notify you surgeon.      Pre-operative CHG Bathing Instructions   You can play a key role in reducing the risk of infection after surgery. Your skin needs to be as free of germs as possible. You can reduce the number of germs on your skin by washing with CHG (chlorhexidine  gluconate) soap before surgery. CHG is an antiseptic soap that kills germs and continues to kill germs even after washing.   DO NOT use if you have an allergy to chlorhexidine /CHG or antibacterial soaps. If your skin becomes reddened or irritated, stop using the CHG and notify one of our RNs at [phone].  TAKE A SHOWER THE NIGHT BEFORE SURGERY AND THE DAY OF SURGERY    Please keep in mind the following:  DO NOT shave, including legs and underarms, 48 hours prior to surgery.   You may shave your face before/day of surgery.  Place clean sheets on your bed the night before surgery Use a clean washcloth (not used since being washed) for each shower. DO NOT sleep with pet's night before surgery.  CHG Shower Instructions:  Wash your face and private area with normal soap. If you choose to wash your hair, wash first with your normal shampoo.  After you use shampoo/soap, rinse your  hair and body thoroughly to remove shampoo/soap residue.  Turn the water OFF and apply half the bottle of CHG soap to a CLEAN washcloth.  Apply CHG soap ONLY FROM YOUR NECK DOWN TO YOUR TOES (washing for 3-5 minutes)  DO NOT use CHG soap on face, private areas, open wounds, or sores.  Pay special attention to the area where your surgery is being performed.  If you are having back surgery, having someone wash your back for you may be helpful. Wait 2 minutes after CHG soap is applied, then you may rinse off the CHG soap.  Pat dry with a clean towel  Put on clean pajamas    Additional instructions for the day of surgery: DO NOT APPLY any lotions, deodorants, cologne, or perfumes.   Do not wear jewelry or makeup Do not wear nail polish, gel polish, artificial nails, or any other type of covering on natural nails (fingers and toes) Do not bring valuables to the hospital. [facility] is not responsible for valuables/personal belongings. Put on clean/comfortable clothes.  Please brush your teeth.  Ask your nurse before applying any prescription medications to the skin.

## 2023-12-24 ENCOUNTER — Encounter (HOSPITAL_COMMUNITY): Payer: Self-pay

## 2023-12-24 ENCOUNTER — Encounter (HOSPITAL_COMMUNITY)
Admission: RE | Admit: 2023-12-24 | Discharge: 2023-12-24 | Disposition: A | Discharge status: Home / Self Care | Source: Ambulatory Visit | Attending: General Surgery | Admitting: General Surgery

## 2023-12-24 ENCOUNTER — Other Ambulatory Visit: Payer: Self-pay

## 2023-12-24 DIAGNOSIS — Z01818 Encounter for other preprocedural examination: Secondary | ICD-10-CM | POA: Diagnosis not present

## 2023-12-24 DIAGNOSIS — K8689 Other specified diseases of pancreas: Secondary | ICD-10-CM | POA: Diagnosis not present

## 2023-12-24 LAB — CBC WITH DIFFERENTIAL/PLATELET
Abs Immature Granulocytes: 0.02 K/uL (ref 0.00–0.07)
Basophils Absolute: 0 K/uL (ref 0.0–0.1)
Basophils Relative: 1 %
Eosinophils Absolute: 0.3 K/uL (ref 0.0–0.5)
Eosinophils Relative: 6 %
HCT: 40.2 % (ref 39.0–52.0)
Hemoglobin: 14.1 g/dL (ref 13.0–17.0)
Immature Granulocytes: 0 %
Lymphocytes Relative: 24 %
Lymphs Abs: 1.3 K/uL (ref 0.7–4.0)
MCH: 32.7 pg (ref 26.0–34.0)
MCHC: 35.1 g/dL (ref 30.0–36.0)
MCV: 93.3 fL (ref 80.0–100.0)
Monocytes Absolute: 0.4 K/uL (ref 0.1–1.0)
Monocytes Relative: 8 %
Neutro Abs: 3.4 K/uL (ref 1.7–7.7)
Neutrophils Relative %: 61 %
Platelets: 151 K/uL (ref 150–400)
RBC: 4.31 MIL/uL (ref 4.22–5.81)
RDW: 12.1 % (ref 11.5–15.5)
WBC: 5.6 K/uL (ref 4.0–10.5)
nRBC: 0 % (ref 0.0–0.2)

## 2023-12-24 LAB — COMPREHENSIVE METABOLIC PANEL WITH GFR
ALT: 17 U/L (ref 0–44)
AST: 19 U/L (ref 15–41)
Albumin: 4.1 g/dL (ref 3.5–5.0)
Alkaline Phosphatase: 47 U/L (ref 38–126)
Anion gap: 9 (ref 5–15)
BUN: 12 mg/dL (ref 8–23)
CO2: 25 mmol/L (ref 22–32)
Calcium: 9.1 mg/dL (ref 8.9–10.3)
Chloride: 102 mmol/L (ref 98–111)
Creatinine, Ser: 0.87 mg/dL (ref 0.61–1.24)
GFR, Estimated: >60 mL/min (ref >60)
Glucose, Bld: 108 mg/dL — ABNORMAL HIGH (ref 70–99)
Potassium: 3.4 mmol/L — ABNORMAL LOW (ref 3.5–5.1)
Sodium: 136 mmol/L (ref 135–145)
Total Bilirubin: 1 mg/dL (ref 0.0–1.2)
Total Protein: 7 g/dL (ref 6.5–8.1)

## 2023-12-24 LAB — TYPE AND SCREEN
ABO/RH(D): A NEG
Antibody Screen: NEGATIVE
Weak D: POSITIVE

## 2023-12-24 NOTE — Progress Notes (Signed)
 PCP - Prentice Batch, FNP Cardiologist - denies  PPM/ICD - Denies Device Orders - na Rep Notified - na  Chest x-ray - na EKG - PAT, 12/24/2023 Stress Test -  ECHO -  Cardiac Cath -   Sleep Study -  denies. Thinks he has sleep apnea CPAP - denies  Non-diabetic  Blood Thinner Instructions: denies Aspirin Instructions:denies  ERAS Protcol - Ensure until 0530  Anesthesia review: No  Patient denies shortness of breath, fever, cough and chest pain at PAT appointment   All instructions explained to the patient, with a verbal understanding of the material. Patient agrees to go over the instructions while at home for a better understanding. Patient also instructed to self quarantine after being tested for COVID-19. The opportunity to ask questions was provided.

## 2023-12-29 NOTE — Anesthesia Preprocedure Evaluation (Signed)
 Anesthesia Evaluation  Patient identified by MRN, date of birth, ID band Patient awake    Reviewed: Allergy & Precautions, NPO status , Patient's Chart, lab work & pertinent test results  History of Anesthesia Complications (+) PONV and history of anesthetic complications  Airway Mallampati: III  TM Distance: >3 FB Neck ROM: Full    Dental no notable dental hx. (+) Teeth Intact, Dental Advisory Given   Pulmonary sleep apnea , former smoker   Pulmonary exam normal breath sounds clear to auscultation       Cardiovascular hypertension, Pt. on medications Normal cardiovascular exam Rhythm:Regular Rate:Normal     Neuro/Psych  Headaches PSYCHIATRIC DISORDERS Anxiety        GI/Hepatic negative GI ROS, Neg liver ROS,,,  Endo/Other  negative endocrine ROS    Renal/GU negative Renal ROS  negative genitourinary   Musculoskeletal negative musculoskeletal ROS (+)    Abdominal   Peds  Hematology negative hematology ROS (+)   Anesthesia Other Findings Pancreatic cyst  Reproductive/Obstetrics                              Anesthesia Physical Anesthesia Plan  ASA: 2  Anesthesia Plan: General   Post-op Pain Management: Tylenol  PO (pre-op)*, Ketamine  IV* and Dilaudid  IV   Induction: Intravenous  PONV Risk Score and Plan: 3 and Dexamethasone , Ondansetron  and Treatment may vary due to age or medical condition  Airway Management Planned: Oral ETT  Additional Equipment: Arterial line  Intra-op Plan:   Post-operative Plan: Extubation in OR  Informed Consent: I have reviewed the patients History and Physical, chart, labs and discussed the procedure including the risks, benefits and alternatives for the proposed anesthesia with the patient or authorized representative who has indicated his/her understanding and acceptance.     Dental advisory given  Plan Discussed with: CRNA  Anesthesia Plan  Comments:          Anesthesia Quick Evaluation

## 2023-12-29 NOTE — H&P (Signed)
 Chief Complaint  Patient presents with   New Consultation      malignant appearing pancreatic cyst.       Referring MD: Outlaw   HISTORY:   Patient presents for referral by Dr. Burnette for a newly diagnosed pancreatic cystic mass.  The patient had this incidentally discovered when he underwent a screening chest CT for high risk lung cancer screening.  He has a smoking history although he has not smoked for 30 years and had a lingering cough.  He was seen to have a complex cystic mass in the tail of the pancreas and MRI was recommended.  MR abdomen was then performed and that showed a 7.2 cm complex rim-enhancing cystic lesion in the distal pancreatic tail.  There were contrast-enhancing internal septations and mural nodules.  He saw Dr. Burnette who performed an endoscopic ultrasound.  This confirmed these findings.  One of the mural nodules was 18 mm in greatest dimension.  There also was debris in one of the cavities.  A FNA was performed which was benign.  I cannot tell if any fluid was sent.   The patient has no abdominal symptoms.  He denies any weight loss or abdominal pain.  He denies any diagnosis of diabetes.  He has not had any diarrhea.  He is able to play golf.  He has good exercise tolerance with this.  He no longer drinks any alcohol  and has not for over 20 years.  He is a prostate cancer survivor.   He is accompanied by his wife.   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 *Calcifications of the proximal ascending aorta aortic valve and coronary artery calcifications. *Complex lobular cystic appearing mass within the region of the tail of the pancreas measuring 6.7 x 5.4 x 4.2 cm in the AP, transverse and longitudinal diameter with high attenuation changes along the inferior margin. 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 Clinical correlation with prior medical history of pancreatitis AP helpful. *Calcifications in the left kidney upper midpole calices consistent with nephrolithiasis without hydronephrosis. *Aorta recommendation: Recommend followup by ultrasound in 1 year.     MR abdomen 09/30/23  IMPRESSION: 1. Complex rim enhancing cystic lesion arising from the distal pancreatic tail measuring 7.2 x 6.2 cm. This contains complex, contrast enhancing internal septation and nodularity, most concentrated in the dorsal aspect of the lesion. 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 Consider tissue sampling. 2. No evidence of lymphadenopathy or metastatic disease in the abdomen. 3. Contracted gallbladder or gallbladder remnant.  No gallstones.  These results will be called to the ordering clinician or representative by the Radiologist Assistant, and communication documented in the PACS or [employer].  Aortic Atherosclerosis (ICD10-I70.0).     EUS: 11/19/23 Outlaw An anechoic, hypoechoic, multicystic and septated lesion suggestive of a cyst was identified in the pancreatic tail. It is not in obvious communication with the pancreatic duct. The cystic lesion measured 60 mm by 60 mm in maximal cross- sectional diameter. There were a few compartments thickly septated. The outer wall of the lesion was thin. There was an associated mural nodule in the wall, about 14 x 18 mm in diameter. There was internal debris within the fluid- filled cavity. Fine needle aspiration for cytology was performed. Color Doppler imaging was utilized  prior to needle puncture to confirm a lack of significant vascular structures within the needle path. One pass was made with the 22 gauge needle using a transduodenal approach. A stylet was used. A preliminary cytologic examination was not performed. Separate pass made for aspiration of cyst fluid; about 5 mL of thick  off- white fluid was obtained. Final cytology results are pending.   Cytology 11/19/23 A. PANCREAS TAIL, CYST, FINE NEEDLE ASPIRATION:  - No malignant cells identified  - Inflammation and cyst contents  B. PANCREAS TAIL, MURAL NODULE, FINE NEEDLE ASPIRATION:  - No malignant cells identified  - Benign reactive/reparative changes        Past Medical History      Past Medical History:  Diagnosis Date   Allergic rhinitis due to allergen     History of cataract     Hypertension     Malignant neoplasm of prostate (CMS/HHS-HCC) 05/08/2017   Prostate cancer (CMS/HHS-HCC)     Sleep apnea     Vision abnormalities          Past Surgical History       Past Surgical History:  Procedure Laterality Date   CORNEAL EYE SURGERY Left 11/26/2017    Pterygium excision w/Dr. Gifford    CHOLECYSTECTOMY       EXCISION NASAL POLYPS       prostate surgery            Current Medications        Current Outpatient Medications  Medication Sig Dispense Refill   albuterol MDI, PROVENTIL, VENTOLIN, PROAIR, HFA 90 mcg/actuation inhaler Inhale 1 Puff into the lungs every 4 (four) hours as needed       albuterol MDI, PROVENTIL, VENTOLIN, PROAIR, HFA 90 mcg/actuation inhaler 2 puffs as needed for wheezing or shortness of breath Inhalation every 6 hours; Duration: 30 days       albuterol-budesonide 90-80 mcg/actuation HFAA         amLODIPine  (NORVASC ) 2.5 MG tablet Take 2.5 mg by mouth once daily       amLODIPine  (NORVASC ) 2.5 MG tablet Take 2.5 mg by mouth once daily       amLODIPine  (NORVASC ) 5 MG tablet Take 5 mg by mouth once daily       Bacillus subtilis-inulin 1.5 billion cell-1 gram Chew Take by mouth as directed       lactobacillus comb no.10 (PROBIOTIC) 20 billion cell Cap Take by mouth once daily       loratadine  (CLARITIN ) 10 mg tablet 1 tablet Orally Once a day       magnesium  glycinate 118 mg magnesium  Cap Take 200 mg by mouth once daily       montelukast  (SINGULAIR ) 10 mg tablet Take 1  tablet by mouth at bedtime       multivitamin with minerals (MULTI-VIT 55 PLUS ORAL)         multivitamin-lutein (MULTIVITAMIN 50 PLUS) tablet Take 1 tablet by mouth once daily       ascorbic acid, vitamin C, (VITAMIN C) 500 MG tablet Take by mouth       aspirin 81 MG EC tablet Take by mouth       atenolol (TENORMIN) 25 MG tablet Take by mouth       b complex vitamins capsule Take by mouth       cetirizine (ZYRTEC) 10 MG tablet Take 10 mg by mouth once daily       DULoxetine (CYMBALTA) 30 MG DR capsule Take by mouth  fluticasone  propionate (FLONASE ) 50 mcg/actuation nasal spray by Nasal route       hydroCHLOROthiazide (HYDRODIURIL) 25 MG tablet Take by mouth       irbesartan  (AVAPRO ) 150 MG tablet Take by mouth       moxifloxacin (VIGAMOX) 0.5 % ophthalmic solution 1 drop in the left eye 4 times a day. Begin 1 day prior to surgery and continue for 1 week after surgery (Patient not taking: Reported on 02/11/2018 ) 3 mL 0   prednisoLONE acetate (PRED FORTE) 1 % ophthalmic suspension Place 1 drop into the left eye 4 (four) times daily       tamsulosin  (FLOMAX ) 0.4 mg capsule Take 0.4 mg by mouth once daily Take 30 minutes after same meal each day.       traZODone (DESYREL) 50 MG tablet Take 50 mg by mouth at bedtime       zinc gluconate 50 mg tablet Take by mouth        No current facility-administered medications for this visit.          Allergies  No Known Allergies       Family History        Family History  Problem Relation Name Age of Onset   High blood pressure (Hypertension) Mother       Diabetes type I Mother       Coronary Artery Disease (Blocked arteries around heart) Father       Sleep apnea Father              Social History  Social History        Socioeconomic History   Marital status: Married  Tobacco Use   Smoking status: Former   Smokeless tobacco: Never  Advertising account planner   Vaping status: Unknown  Substance and Sexual Activity   Alcohol  use: Never    Drug use: Never          REVIEW OF SYSTEMS - PERTINENT POSITIVES ONLY: A complete review of systems negative other than HPI and PMH except for nasal congestion   EXAM:    Vitals:    12/02/23 1458  BP: (!) 154/81  Pulse: 94  Temp: 37.3 C (99.1 F)         Wt Readings from Last 3 Encounters:  12/02/23 86 kg (189 lb 9.6 oz)        Gen:  No acute distress.  Well nourished and well groomed.   Neurological: Alert and oriented to person, place, and time. Coordination normal.  Head: Normocephalic and atraumatic.  Eyes: Conjunctivae are normal. Pupils are equal, round, and reactive to light. No scleral icterus.  Neck: Normal range of motion. Neck supple. No tracheal deviation or thyromegaly present.  Cardiovascular: Normal rate, regular rhythm, normal heart sounds and intact distal pulses.  Exam reveals no gallop and no friction rub.  No murmur heard. Respiratory: Effort normal.  No respiratory distress. No chest wall tenderness. Breath sounds normal.  No wheezes, rales or rhonchi.  GI: Soft. Bowel sounds are normal. The abdomen is soft and nontender.  There is no rebound and no guarding.  Musculoskeletal: Normal range of motion. Extremities are nontender.  Lymphadenopathy: No cervical, preauricular, postauricular or axillary adenopathy is present Skin: Skin is warm and dry. No rash noted. No diaphoresis. No erythema. No pallor. No clubbing, cyanosis, or edema.   Psychiatric: Normal mood and affect. Behavior is normal. Judgment and thought content normal.      LABORATORY RESULTS: Available labs are reviewed  Labs from [facility] April 2025 normal complete metabolic panel other than a T. bili of 1.1.  CBC normal at that time.   Labs from June 2025 show normal c-Met other than glucose of 101.  CA 19-9 of 28 CEA of 2.2.   RADIOLOGY RESULTS: See above.         ASSESSMENT AND PLAN: Cystic mass of pancreas (HHS-HCC)  (primary encounter diagnosis)   History of prostate cancer    Patient has a cystic pancreatic mass that meets all of the concerning high risk feature categories.  I definitely recommend removal.  I discussed with the patient and his wife that surveillance would not be recommended because every time we would repeat a scan we would have exactly the same decision points and conversation.  I discussed that the risk would be that he would be less healthy every time that he would get repeat scan.  I also discussed that with these high risk features there is an increased risk of cancer compared to other cystic pancreatic masses.   Recommend a distal pancreatectomy with splenectomy.  I reviewed that I could do this with the laparoscopic or robotic approach.  I discussed the surgery along with the risks.  I reviewed risk of bleeding, infection, damage to adjacent structures, heart or lung complications, blood clot, death.  I specifically addressed risk of pancreatic endocrine exocrine insufficiency.  I reviewed what can happen with pancreatic leak.  I discussed risk of prolonged drain placement.  I discussed risk of additional procedures.   I reviewed anatomic diagrams and to the course of surgery.  I reviewed hospital stay, discharge requirements, and postoperative restrictions and expectations.  I discussed recovery timeframe.   [facility] that [facility] to [facility].  I gave him a vaccine prescription.  He has already had a Pneumovax.  He is going to get the haemophilus influenza and Neisseria meningitidis vaccines.   We will do this at the first mutually available opportunity.

## 2023-12-30 ENCOUNTER — Inpatient Hospital Stay (HOSPITAL_COMMUNITY)
Admission: RE | Admit: 2023-12-30 | Discharge: 2024-01-04 | DRG: 406 | Disposition: A | Discharge status: Home / Self Care | Attending: General Surgery | Admitting: General Surgery

## 2023-12-30 ENCOUNTER — Encounter (HOSPITAL_COMMUNITY)
Admission: RE | Disposition: A | Discharge status: Home / Self Care | Payer: Self-pay | Source: Home / Self Care | Attending: General Surgery

## 2023-12-30 ENCOUNTER — Inpatient Hospital Stay (HOSPITAL_COMMUNITY): Admitting: Certified Registered Nurse Anesthetist

## 2023-12-30 ENCOUNTER — Other Ambulatory Visit: Payer: Self-pay

## 2023-12-30 ENCOUNTER — Encounter (HOSPITAL_COMMUNITY): Payer: Self-pay | Admitting: General Surgery

## 2023-12-30 DIAGNOSIS — K8689 Other specified diseases of pancreas: Secondary | ICD-10-CM

## 2023-12-30 DIAGNOSIS — R339 Retention of urine, unspecified: Secondary | ICD-10-CM | POA: Diagnosis present

## 2023-12-30 DIAGNOSIS — I10 Essential (primary) hypertension: Secondary | ICD-10-CM | POA: Diagnosis present

## 2023-12-30 DIAGNOSIS — Z87891 Personal history of nicotine dependence: Secondary | ICD-10-CM | POA: Diagnosis not present

## 2023-12-30 DIAGNOSIS — Z79899 Other long term (current) drug therapy: Secondary | ICD-10-CM | POA: Diagnosis not present

## 2023-12-30 DIAGNOSIS — T8149XA Infection following a procedure, other surgical site, initial encounter: Secondary | ICD-10-CM | POA: Diagnosis not present

## 2023-12-30 DIAGNOSIS — K862 Cyst of pancreas: Principal | ICD-10-CM | POA: Diagnosis present

## 2023-12-30 DIAGNOSIS — K30 Functional dyspepsia: Secondary | ICD-10-CM | POA: Diagnosis not present

## 2023-12-30 DIAGNOSIS — Z8249 Family history of ischemic heart disease and other diseases of the circulatory system: Secondary | ICD-10-CM | POA: Diagnosis not present

## 2023-12-30 DIAGNOSIS — Z8546 Personal history of malignant neoplasm of prostate: Secondary | ICD-10-CM

## 2023-12-30 DIAGNOSIS — Z9049 Acquired absence of other specified parts of digestive tract: Secondary | ICD-10-CM | POA: Diagnosis not present

## 2023-12-30 DIAGNOSIS — Y838 Other surgical procedures as the cause of abnormal reaction of the patient, or of later complication, without mention of misadventure at the time of the procedure: Secondary | ICD-10-CM | POA: Diagnosis not present

## 2023-12-30 DIAGNOSIS — Z833 Family history of diabetes mellitus: Secondary | ICD-10-CM | POA: Diagnosis not present

## 2023-12-30 DIAGNOSIS — L03311 Cellulitis of abdominal wall: Secondary | ICD-10-CM | POA: Diagnosis not present

## 2023-12-30 DIAGNOSIS — R319 Hematuria, unspecified: Secondary | ICD-10-CM | POA: Diagnosis not present

## 2023-12-30 DIAGNOSIS — K660 Peritoneal adhesions (postprocedural) (postinfection): Secondary | ICD-10-CM | POA: Diagnosis present

## 2023-12-30 HISTORY — PX: PANCREATECTOMY: SHX5261

## 2023-12-30 LAB — POCT I-STAT 7, (LYTES, BLD GAS, ICA,H+H)
Acid-base deficit: 3 mmol/L — ABNORMAL HIGH (ref 0.0–2.0)
Bicarbonate: 22.5 mmol/L (ref 20.0–28.0)
Calcium, Ion: 1.17 mmol/L (ref 1.15–1.40)
HCT: 34 % — ABNORMAL LOW (ref 39.0–52.0)
Hemoglobin: 11.6 g/dL — ABNORMAL LOW (ref 13.0–17.0)
O2 Saturation: 100 %
Potassium: 3.7 mmol/L (ref 3.5–5.1)
Sodium: 135 mmol/L (ref 135–145)
TCO2: 24 mmol/L (ref 22–32)
pCO2 arterial: 41.1 mmHg (ref 32–48)
pH, Arterial: 7.347 — ABNORMAL LOW (ref 7.35–7.45)
pO2, Arterial: 226 mmHg — ABNORMAL HIGH (ref 83–108)

## 2023-12-30 LAB — CBC
HCT: 38.9 % — ABNORMAL LOW (ref 39.0–52.0)
Hemoglobin: 13.6 g/dL (ref 13.0–17.0)
MCH: 32.5 pg (ref 26.0–34.0)
MCHC: 35 g/dL (ref 30.0–36.0)
MCV: 93.1 fL (ref 80.0–100.0)
Platelets: 208 K/uL (ref 150–400)
RBC: 4.18 MIL/uL — ABNORMAL LOW (ref 4.22–5.81)
RDW: 12.3 % (ref 11.5–15.5)
WBC: 14.5 K/uL — ABNORMAL HIGH (ref 4.0–10.5)
nRBC: 0 % (ref 0.0–0.2)

## 2023-12-30 LAB — GLUCOSE, CAPILLARY: Glucose-Capillary: 171 mg/dL — ABNORMAL HIGH (ref 70–99)

## 2023-12-30 LAB — CREATININE, SERUM
Creatinine, Ser: 1.02 mg/dL (ref 0.61–1.24)
GFR, Estimated: >60 mL/min (ref >60)

## 2023-12-30 LAB — ABO/RH: ABO/RH(D): A NEG

## 2023-12-30 SURGERY — PANCREATECTOMY, LAPAROSCOPIC
Anesthesia: General

## 2023-12-30 MED ORDER — PROCHLORPERAZINE EDISYLATE 10 MG/2ML IJ SOLN: 5.0000 mg | Freq: Four times a day (QID) | INTRAMUSCULAR | Status: DC | PRN

## 2023-12-30 MED ORDER — PROPOFOL 10 MG/ML IV BOLUS
INTRAVENOUS | Status: AC
  Filled 2023-12-30: qty 20

## 2023-12-30 MED ORDER — ONDANSETRON 4 MG PO TBDP
4.0000 mg | ORAL_TABLET | Freq: Four times a day (QID) | ORAL | Status: DC | PRN
Start: 2023-12-30 — End: 2024-01-04

## 2023-12-30 MED ORDER — DIPHENHYDRAMINE HCL 12.5 MG/5ML PO ELIX: 12.5000 mg | ORAL_SOLUTION | Freq: Four times a day (QID) | ORAL | Status: DC | PRN

## 2023-12-30 MED ORDER — KETAMINE HCL 10 MG/ML IJ SOLN
INTRAMUSCULAR | Status: DC | PRN
  Administered 2023-12-30: 30 mg via INTRAVENOUS
  Administered 2023-12-30: 10 mg via INTRAVENOUS

## 2023-12-30 MED ORDER — HYDROMORPHONE HCL 1 MG/ML IJ SOLN
0.2500 mg | INTRAMUSCULAR | Status: DC | PRN
  Administered 2023-12-30: 0.5 mg via INTRAVENOUS

## 2023-12-30 MED ORDER — AMISULPRIDE (ANTIEMETIC) 5 MG/2ML IV SOLN: 10.0000 mg | Freq: Once | INTRAVENOUS | Status: DC | PRN

## 2023-12-30 MED ORDER — LIDOCAINE 2% (20 MG/ML) 5 ML SYRINGE
INTRAMUSCULAR | Status: DC | PRN
  Administered 2023-12-30: 100 mg via INTRAVENOUS

## 2023-12-30 MED ORDER — ROCURONIUM BROMIDE 10 MG/ML (PF) SYRINGE
PREFILLED_SYRINGE | INTRAVENOUS | Status: AC
  Filled 2023-12-30: qty 10

## 2023-12-30 MED ORDER — HYDROMORPHONE HCL 1 MG/ML IJ SOLN
INTRAMUSCULAR | Status: AC
  Filled 2023-12-30: qty 1

## 2023-12-30 MED ORDER — LORATADINE 10 MG PO TABS
10.0000 mg | ORAL_TABLET | Freq: Every day | ORAL | Status: DC
  Filled 2023-12-30 (×6): qty 1

## 2023-12-30 MED ORDER — ENOXAPARIN SODIUM 40 MG/0.4ML IJ SOSY
40.0000 mg | PREFILLED_SYRINGE | INTRAMUSCULAR | Status: DC
  Administered 2023-12-31 – 2024-01-04 (×5): 40 mg via SUBCUTANEOUS
  Filled 2023-12-30 (×5): qty 0.4

## 2023-12-30 MED ORDER — CEFAZOLIN SODIUM-DEXTROSE 2-4 GM/100ML-% IV SOLN
2.0000 g | INTRAVENOUS | Status: AC
  Administered 2023-12-30 (×2): 2 g via INTRAVENOUS
  Filled 2023-12-30: qty 100

## 2023-12-30 MED ORDER — FENTANYL CITRATE (PF) 250 MCG/5ML IJ SOLN
INTRAMUSCULAR | Status: DC | PRN
  Administered 2023-12-30 (×5): 50 ug via INTRAVENOUS

## 2023-12-30 MED ORDER — PROPOFOL 10 MG/ML IV BOLUS
INTRAVENOUS | Status: DC | PRN
  Administered 2023-12-30: 120 mg via INTRAVENOUS
  Administered 2023-12-30: 30 mg via INTRAVENOUS

## 2023-12-30 MED ORDER — LIDOCAINE HCL 1 % IJ SOLN
INTRAMUSCULAR | Status: DC | PRN
  Administered 2023-12-30: 13 mL via INTRAMUSCULAR

## 2023-12-30 MED ORDER — DIPHENHYDRAMINE HCL 50 MG/ML IJ SOLN
12.5000 mg | Freq: Four times a day (QID) | INTRAMUSCULAR | Status: DC | PRN
Start: 2023-12-30 — End: 2024-01-04

## 2023-12-30 MED ORDER — POLYVINYL ALCOHOL 1.4 % OP SOLN: 1.0000 [drp] | OPHTHALMIC | Status: DC | PRN

## 2023-12-30 MED ORDER — ACETAMINOPHEN 500 MG PO TABS
1000.0000 mg | ORAL_TABLET | ORAL | Status: AC
  Administered 2023-12-30: 1000 mg via ORAL
  Filled 2023-12-30: qty 2

## 2023-12-30 MED ORDER — HYDROMORPHONE HCL 1 MG/ML IJ SOLN
INTRAMUSCULAR | Status: AC
  Filled 2023-12-30: qty 0.5

## 2023-12-30 MED ORDER — HYDRALAZINE HCL 20 MG/ML IJ SOLN
INTRAMUSCULAR | Status: DC | PRN
  Administered 2023-12-30 (×2): 2.5 mg via INTRAVENOUS

## 2023-12-30 MED ORDER — SENNOSIDES-DOCUSATE SODIUM 8.6-50 MG PO TABS
1.0000 | ORAL_TABLET | Freq: Every evening | ORAL | Status: DC | PRN
Start: 2023-12-30 — End: 2024-01-04

## 2023-12-30 MED ORDER — FLUTICASONE PROPIONATE 50 MCG/ACT NA SUSP
1.0000 | Freq: Every day | NASAL | Status: DC
  Administered 2023-12-30 – 2023-12-31 (×2): 1 via NASAL
  Filled 2023-12-30: qty 16

## 2023-12-30 MED ORDER — BUPIVACAINE-EPINEPHRINE (PF) 0.25% -1:200000 IJ SOLN
INTRAMUSCULAR | Status: AC
  Filled 2023-12-30: qty 30

## 2023-12-30 MED ORDER — CEFAZOLIN SODIUM-DEXTROSE 2-4 GM/100ML-% IV SOLN
2.0000 g | Freq: Three times a day (TID) | INTRAVENOUS | Status: AC
  Administered 2023-12-30: 2 g via INTRAVENOUS
  Filled 2023-12-30: qty 100

## 2023-12-30 MED ORDER — DEXAMETHASONE SODIUM PHOSPHATE 10 MG/ML IJ SOLN
INTRAMUSCULAR | Status: AC
  Filled 2023-12-30: qty 1

## 2023-12-30 MED ORDER — DEXAMETHASONE SODIUM PHOSPHATE 10 MG/ML IJ SOLN
INTRAMUSCULAR | Status: DC | PRN
  Administered 2023-12-30: 10 mg via INTRAVENOUS

## 2023-12-30 MED ORDER — CHLORHEXIDINE GLUCONATE 0.12 % MT SOLN
15.0000 mL | Freq: Once | OROMUCOSAL | Status: AC
  Administered 2023-12-30: 15 mL via OROMUCOSAL
  Filled 2023-12-30: qty 15

## 2023-12-30 MED ORDER — OXYCODONE HCL 5 MG/5ML PO SOLN: 5.0000 mg | Freq: Once | ORAL | Status: DC | PRN

## 2023-12-30 MED ORDER — ALBUMIN HUMAN 5 % IV SOLN: INTRAVENOUS | Status: DC | PRN

## 2023-12-30 MED ORDER — MAGNESIUM CHLORIDE 64 MG PO TBEC
1.0000 | DELAYED_RELEASE_TABLET | Freq: Every day | ORAL | Status: DC
  Administered 2023-12-30 – 2024-01-04 (×6): 64 mg via ORAL
  Filled 2023-12-30 (×6): qty 1

## 2023-12-30 MED ORDER — ONDANSETRON HCL 4 MG/2ML IJ SOLN
INTRAMUSCULAR | Status: DC | PRN
  Administered 2023-12-30: 4 mg via INTRAVENOUS

## 2023-12-30 MED ORDER — AMLODIPINE BESYLATE 5 MG PO TABS
5.0000 mg | ORAL_TABLET | Freq: Every evening | ORAL | Status: DC
  Administered 2023-12-30 – 2024-01-03 (×5): 5 mg via ORAL
  Filled 2023-12-30 (×5): qty 1

## 2023-12-30 MED ORDER — ACETAMINOPHEN 10 MG/ML IV SOLN
INTRAVENOUS | Status: AC
  Filled 2023-12-30: qty 100

## 2023-12-30 MED ORDER — IRBESARTAN 300 MG PO TABS
300.0000 mg | ORAL_TABLET | Freq: Every day | ORAL | Status: DC
  Administered 2023-12-31 – 2024-01-04 (×5): 300 mg via ORAL
  Filled 2023-12-30 (×5): qty 1

## 2023-12-30 MED ORDER — CHLORHEXIDINE GLUCONATE CLOTH 2 % EX PADS: 6.0000 | MEDICATED_PAD | Freq: Once | CUTANEOUS | Status: DC

## 2023-12-30 MED ORDER — ONDANSETRON HCL 4 MG/2ML IJ SOLN
INTRAMUSCULAR | Status: AC
  Filled 2023-12-30: qty 2

## 2023-12-30 MED ORDER — 0.9 % SODIUM CHLORIDE (POUR BTL) OPTIME
TOPICAL | Status: DC | PRN
  Administered 2023-12-30 (×2): 1000 mL

## 2023-12-30 MED ORDER — HYDROMORPHONE HCL 1 MG/ML IJ SOLN
0.2500 mg | INTRAMUSCULAR | Status: DC | PRN
  Administered 2023-12-30 (×4): 0.5 mg via INTRAVENOUS

## 2023-12-30 MED ORDER — LIDOCAINE HCL (PF) 1 % IJ SOLN
INTRAMUSCULAR | Status: AC
  Filled 2023-12-30: qty 30

## 2023-12-30 MED ORDER — MAGNESIUM GLYCINATE 100 MG PO CAPS: ORAL_CAPSULE | Freq: Every day | ORAL | Status: DC

## 2023-12-30 MED ORDER — ONDANSETRON HCL 4 MG/2ML IJ SOLN: 4.0000 mg | Freq: Four times a day (QID) | INTRAMUSCULAR | Status: DC | PRN

## 2023-12-30 MED ORDER — HYDROMORPHONE HCL 1 MG/ML IJ SOLN
INTRAMUSCULAR | Status: DC | PRN
  Administered 2023-12-30 (×5): .5 mg via INTRAVENOUS

## 2023-12-30 MED ORDER — ORAL CARE MOUTH RINSE: 15.0000 mL | Freq: Once | OROMUCOSAL | Status: AC

## 2023-12-30 MED ORDER — ACETAMINOPHEN 10 MG/ML IV SOLN
1000.0000 mg | Freq: Once | INTRAVENOUS | Status: DC | PRN
  Administered 2023-12-30: 1000 mg via INTRAVENOUS

## 2023-12-30 MED ORDER — DOCUSATE SODIUM 100 MG PO CAPS
100.0000 mg | ORAL_CAPSULE | Freq: Two times a day (BID) | ORAL | Status: DC
  Administered 2023-12-30 – 2024-01-04 (×10): 100 mg via ORAL
  Filled 2023-12-30 (×10): qty 1

## 2023-12-30 MED ORDER — MONTELUKAST SODIUM 10 MG PO TABS
10.0000 mg | ORAL_TABLET | Freq: Every day | ORAL | Status: DC
  Administered 2024-01-01: 10 mg via ORAL
  Filled 2023-12-30 (×5): qty 1

## 2023-12-30 MED ORDER — MORPHINE SULFATE (PF) 2 MG/ML IV SOLN
1.0000 mg | INTRAVENOUS | Status: DC | PRN
  Administered 2023-12-30 – 2023-12-31 (×5): 2 mg via INTRAVENOUS
  Filled 2023-12-30 (×5): qty 1

## 2023-12-30 MED ORDER — LACTATED RINGERS IV SOLN: INTRAVENOUS | Status: DC | PRN

## 2023-12-30 MED ORDER — HYDRALAZINE HCL 20 MG/ML IJ SOLN
INTRAMUSCULAR | Status: AC
  Filled 2023-12-30: qty 1

## 2023-12-30 MED ORDER — ROCURONIUM BROMIDE 10 MG/ML (PF) SYRINGE
PREFILLED_SYRINGE | INTRAVENOUS | Status: DC | PRN
  Administered 2023-12-30 (×2): 20 mg via INTRAVENOUS
  Administered 2023-12-30: 100 mg via INTRAVENOUS
  Administered 2023-12-30: 20 mg via INTRAVENOUS

## 2023-12-30 MED ORDER — ADULT MULTIVITAMIN W/MINERALS CH
1.0000 | ORAL_TABLET | Freq: Every day | ORAL | Status: DC
  Administered 2023-12-31 – 2024-01-04 (×5): 1 via ORAL
  Filled 2023-12-30 (×6): qty 1

## 2023-12-30 MED ORDER — KCL IN DEXTROSE-NACL 20-5-0.45 MEQ/L-%-% IV SOLN
INTRAVENOUS | Status: AC
  Filled 2023-12-30 (×2): qty 1000

## 2023-12-30 MED ORDER — MIDAZOLAM HCL 2 MG/2ML IJ SOLN
INTRAMUSCULAR | Status: AC
  Filled 2023-12-30: qty 2

## 2023-12-30 MED ORDER — KETAMINE HCL 50 MG/5ML IJ SOSY
PREFILLED_SYRINGE | INTRAMUSCULAR | Status: AC
  Filled 2023-12-30: qty 5

## 2023-12-30 MED ORDER — PROPYLENE GLYCOL 0.6 % OP SOLN: OPHTHALMIC | Status: DC | PRN

## 2023-12-30 MED ORDER — GABAPENTIN 100 MG PO CAPS
100.0000 mg | ORAL_CAPSULE | Freq: Two times a day (BID) | ORAL | Status: DC
  Administered 2023-12-30 – 2024-01-04 (×10): 100 mg via ORAL
  Filled 2023-12-30 (×10): qty 1

## 2023-12-30 MED ORDER — METHOCARBAMOL 1000 MG/10ML IJ SOLN
500.0000 mg | Freq: Three times a day (TID) | INTRAMUSCULAR | Status: DC | PRN
  Administered 2023-12-30 – 2023-12-31 (×2): 500 mg via INTRAVENOUS
  Filled 2023-12-30 (×2): qty 10

## 2023-12-30 MED ORDER — VISTASEAL 10 ML SINGLE DOSE KIT
10.0000 mL | PACK | Freq: Once | CUTANEOUS | Status: AC
  Administered 2023-12-30: 10 mL via TOPICAL
  Filled 2023-12-30: qty 10

## 2023-12-30 MED ORDER — BISACODYL 10 MG RE SUPP: 10.0000 mg | Freq: Every day | RECTAL | Status: DC | PRN

## 2023-12-30 MED ORDER — MELATONIN 3 MG PO TABS
3.0000 mg | ORAL_TABLET | Freq: Every evening | ORAL | Status: DC | PRN
  Administered 2023-12-30 – 2024-01-03 (×3): 3 mg via ORAL
  Filled 2023-12-30 (×3): qty 1

## 2023-12-30 MED ORDER — ACETAMINOPHEN 500 MG PO TABS
1000.0000 mg | ORAL_TABLET | Freq: Four times a day (QID) | ORAL | Status: DC
  Administered 2023-12-30 – 2024-01-04 (×16): 1000 mg via ORAL
  Filled 2023-12-30 (×18): qty 2

## 2023-12-30 MED ORDER — OXYCODONE HCL 5 MG PO TABS: 5.0000 mg | ORAL_TABLET | Freq: Once | ORAL | Status: DC | PRN

## 2023-12-30 MED ORDER — FENTANYL CITRATE (PF) 250 MCG/5ML IJ SOLN
INTRAMUSCULAR | Status: AC
  Filled 2023-12-30: qty 5

## 2023-12-30 MED ORDER — PHENYLEPHRINE HCL-NACL 20-0.9 MG/250ML-% IV SOLN
INTRAVENOUS | Status: DC | PRN
  Administered 2023-12-30: 40 ug/min via INTRAVENOUS

## 2023-12-30 MED ORDER — OXYCODONE HCL 5 MG PO TABS
5.0000 mg | ORAL_TABLET | ORAL | Status: DC | PRN
  Administered 2023-12-30: 10 mg via ORAL
  Administered 2023-12-30: 5 mg via ORAL
  Administered 2023-12-31 – 2024-01-01 (×5): 10 mg via ORAL
  Administered 2024-01-03 – 2024-01-04 (×2): 5 mg via ORAL
  Filled 2023-12-30 (×6): qty 2
  Filled 2023-12-30: qty 1
  Filled 2023-12-30: qty 2
  Filled 2023-12-30: qty 1

## 2023-12-30 MED ORDER — SIMETHICONE 80 MG PO CHEW
40.0000 mg | CHEWABLE_TABLET | Freq: Four times a day (QID) | ORAL | Status: DC | PRN
  Administered 2023-12-31: 40 mg via ORAL
  Filled 2023-12-30: qty 1

## 2023-12-30 MED ORDER — MIDAZOLAM HCL 2 MG/2ML IJ SOLN
INTRAMUSCULAR | Status: DC | PRN
  Administered 2023-12-30 (×2): 1 mg via INTRAVENOUS

## 2023-12-30 MED ORDER — PROCHLORPERAZINE MALEATE 10 MG PO TABS: 10.0000 mg | ORAL_TABLET | Freq: Four times a day (QID) | ORAL | Status: DC | PRN

## 2023-12-30 MED ORDER — PSYLLIUM 95 % PO PACK
1.0000 | PACK | Freq: Two times a day (BID) | ORAL | Status: DC
  Administered 2023-12-31 – 2024-01-04 (×8): 1 via ORAL
  Filled 2023-12-30 (×10): qty 1

## 2023-12-30 MED ORDER — SODIUM CHLORIDE 0.9 % IR SOLN
Status: DC | PRN
  Administered 2023-12-30: 1000 mL

## 2023-12-30 MED ORDER — PHENYLEPHRINE 80 MCG/ML (10ML) SYRINGE FOR IV PUSH (FOR BLOOD PRESSURE SUPPORT)
PREFILLED_SYRINGE | INTRAVENOUS | Status: DC | PRN
  Administered 2023-12-30 (×2): 80 ug via INTRAVENOUS

## 2023-12-30 MED ORDER — SUGAMMADEX SODIUM 200 MG/2ML IV SOLN
INTRAVENOUS | Status: DC | PRN
  Administered 2023-12-30: 200 mg via INTRAVENOUS

## 2023-12-30 MED ORDER — METOPROLOL TARTRATE 5 MG/5ML IV SOLN: 5.0000 mg | Freq: Four times a day (QID) | INTRAVENOUS | Status: DC | PRN

## 2023-12-30 MED ORDER — LACTATED RINGERS IV SOLN: INTRAVENOUS | Status: DC

## 2023-12-30 MED ORDER — LIDOCAINE 2% (20 MG/ML) 5 ML SYRINGE
INTRAMUSCULAR | Status: AC
  Filled 2023-12-30: qty 5

## 2023-12-30 SURGICAL SUPPLY — 90 items
APPLICATOR VISTASEAL 35 (MISCELLANEOUS) IMPLANT
BAG BILE T-TUBES STRL (MISCELLANEOUS) IMPLANT
BIOPATCH RED 1 DISK 7.0 (GAUZE/BANDAGES/DRESSINGS) IMPLANT
BLADE CLIPPER SURG (BLADE) IMPLANT
BLADE KNIFE  10 PERSONNA (BLADE) ×1 IMPLANT
CANISTER SUCTION 3000ML PPV (SUCTIONS) ×1 IMPLANT
CATH KIT ON Q 5IN DUAL SLV (PAIN MANAGEMENT) IMPLANT
CATH KIT ON-Q SILVERSOAK 5 (CATHETERS) ×1 IMPLANT
CHLORAPREP W/TINT 26 (MISCELLANEOUS) ×1 IMPLANT
CLIP APPLIE 5 13 M/L LIGAMAX5 (MISCELLANEOUS) ×1 IMPLANT
CLIP LIGATING HEM O LOK PURPLE (MISCELLANEOUS) IMPLANT
CLIP LIGATING HEMO O LOK GREEN (MISCELLANEOUS) IMPLANT
CLIP LIGATING HEMOLOK MED (MISCELLANEOUS) IMPLANT
CLIP TI LARGE 6 (CLIP) IMPLANT
CLIP TI MEDIUM 24 (CLIP) IMPLANT
CLIP TI WIDE RED SMALL 24 (CLIP) IMPLANT
COVER SURGICAL LIGHT HANDLE (MISCELLANEOUS) ×1 IMPLANT
DERMABOND ADVANCED .7 DNX12 (GAUZE/BANDAGES/DRESSINGS) IMPLANT
DRAIN CHANNEL 19F RND (DRAIN) IMPLANT
DRAIN PENROSE 0.5X18 (DRAIN) IMPLANT
DRAPE UTILITY XL STRL (DRAPES) ×2 IMPLANT
DRAPE WARM FLUID 44X44 (DRAPES) ×1 IMPLANT
DRSG COVADERM 4X10 (GAUZE/BANDAGES/DRESSINGS) IMPLANT
DRSG COVADERM 4X14 (GAUZE/BANDAGES/DRESSINGS) IMPLANT
DRSG TEGADERM 4X4.5 CHG (GAUZE/BANDAGES/DRESSINGS) IMPLANT
ELECT BLADE 6.5 EXT (BLADE) IMPLANT
ELECT CAUTERY BLADE 6.4 (BLADE) ×1 IMPLANT
ELECTRODE REM PT RTRN 9FT ADLT (ELECTROSURGICAL) ×1 IMPLANT
EVACUATOR SILICONE 100CC (DRAIN) IMPLANT
GAUZE SPONGE 4X4 12PLY STRL (GAUZE/BANDAGES/DRESSINGS) IMPLANT
GEL PDS (MISCELLANEOUS) IMPLANT
GLOVE BIO SURGEON STRL SZ 6 (GLOVE) ×1 IMPLANT
GLOVE INDICATOR 6.5 STRL GRN (GLOVE) ×1 IMPLANT
GOWN STRL REUS W/ TWL LRG LVL3 (GOWN DISPOSABLE) ×2 IMPLANT
GOWN STRL REUS W/ TWL XL LVL3 (GOWN DISPOSABLE) ×1 IMPLANT
IRRIGATION SUCT STRKRFLW 2 WTP (MISCELLANEOUS) ×1 IMPLANT
KIT BASIN OR (CUSTOM PROCEDURE TRAY) ×1 IMPLANT
KIT TURNOVER KIT B (KITS) ×1 IMPLANT
LHOOK LAP DISP 36CM (ELECTROSURGICAL) ×1 IMPLANT
NDL 22X1.5 STRL (OR ONLY) (MISCELLANEOUS) ×1 IMPLANT
NEEDLE 22X1.5 STRL (OR ONLY) (MISCELLANEOUS) ×1 IMPLANT
NS IRRIG 1000ML POUR BTL (IV SOLUTION) ×1 IMPLANT
PAD ARMBOARD POSITIONER FOAM (MISCELLANEOUS) ×2 IMPLANT
PENCIL SMOKE EVACUATOR (MISCELLANEOUS) ×1 IMPLANT
POUCH LAPAROSCOPIC INSTRUMENT (MISCELLANEOUS) ×1 IMPLANT
PROBE LAPAROSCOPIC 5MM W/FTSWT (MISCELLANEOUS) IMPLANT
RELOAD STAPLE 60 2.6 WHT THN (STAPLE) IMPLANT
SCISSORS LAP 5X35 DISP (ENDOMECHANICALS) ×1 IMPLANT
SET TUBE SMOKE EVAC HIGH FLOW (TUBING) ×1 IMPLANT
SHEARS 1100 HARMONIC 36 (ELECTROSURGICAL) ×1 IMPLANT
SHEARS HARMONIC 36 ACE (MISCELLANEOUS) IMPLANT
SLEEVE ADV FIXATION 5X100MM (TROCAR) IMPLANT
SLEEVE SURGEON STRL (DRAPES) ×1 IMPLANT
SLEEVE Z-THREAD 5X100MM (TROCAR) ×2 IMPLANT
SPONGE T-LAP 18X18 ~~LOC~~+RFID (SPONGE) IMPLANT
STAPLE ECHEON FLEX 60 POW ENDO (STAPLE) ×1 IMPLANT
STAPLER POWER ECHELON 60 WIDE (STAPLE) IMPLANT
STAPLER RELOADABLE 60 GRN THCK (STAPLE) IMPLANT
STAPLER SKIN PROX 35W (STAPLE) IMPLANT
STRIP PERI DRY VERITAS 60 (STAPLE) IMPLANT
SUT ETHILON 2 0 FS 18 (SUTURE) IMPLANT
SUT MNCRL AB 4-0 PS2 18 (SUTURE) IMPLANT
SUT PDS AB 1 TP1 54 (SUTURE) IMPLANT
SUT PDS AB 1 TP1 96 (SUTURE) IMPLANT
SUT PDS AB 3-0 SH 27 (SUTURE) IMPLANT
SUT PDS II 0 TP-1 LOOPED 60 (SUTURE) IMPLANT
SUT PROLENE 2 0 SH DA (SUTURE) IMPLANT
SUT PROLENE 3 0 SH 48 (SUTURE) IMPLANT
SUT PROLENE 4 0 SH DA (SUTURE) IMPLANT
SUT PROLENE 4-0 RB1 .5 CRCL 36 (SUTURE) IMPLANT
SUT PROLENE 5 0 C1 (SUTURE) IMPLANT
SUT SILK 2 0 SH CR/8 (SUTURE) ×1 IMPLANT
SUT SILK 2 0 TIES 10X30 (SUTURE) ×1 IMPLANT
SUT SILK 3 0 SH CR/8 (SUTURE) ×1 IMPLANT
SUT SILK 3 0 TIES 10X30 (SUTURE) ×1 IMPLANT
SUT VIC AB 3-0 SH 8-18 (SUTURE) IMPLANT
SYSTEM LAPSCP GELPORT 120MM (MISCELLANEOUS) IMPLANT
TAPE UMBILICAL 1/8 X36 TWILL (MISCELLANEOUS) IMPLANT
TOWEL GREEN STERILE FF (TOWEL DISPOSABLE) ×1 IMPLANT
TRAY FOLEY MTR SLVR 14FR STAT (SET/KITS/TRAYS/PACK) ×1 IMPLANT
TRAY LAPAROSCOPIC MC (CUSTOM PROCEDURE TRAY) ×1 IMPLANT
TROCAR BALLN 12MMX100 BLUNT (TROCAR) IMPLANT
TROCAR XCEL NON-BLD 5MMX100MML (ENDOMECHANICALS) IMPLANT
TROCAR Z THREAD OPTICAL 12X100 (TROCAR) ×1 IMPLANT
TROCAR Z-THREAD OPTICAL 5X100M (TROCAR) ×1 IMPLANT
TUBE CONNECTING 12X1/4 (SUCTIONS) ×1 IMPLANT
TUNNELER SHEATH ON-Q 16GX12 DP (PAIN MANAGEMENT) ×1 IMPLANT
WARMER LAPAROSCOPE (MISCELLANEOUS) ×1 IMPLANT
WATER STERILE IRR 1000ML POUR (IV SOLUTION) ×1 IMPLANT
YANKAUER SUCT BULB TIP NO VENT (SUCTIONS) ×1 IMPLANT

## 2023-12-30 NOTE — Op Note (Addendum)
 PREOPERATIVE DIAGNOSIS:  Complex cystic pancreatic tail mass     POSTOPERATIVE DIAGNOSIS:  Same      PROCEDURE:  Diagnostic laparoscopy, laparoscopic hand-assisted distal   pancreatectomy and splenectomy.      SURGEON:  [name], MD      ASSISTANT:  A. Eva Barrier, MD     ANESTHESIA:  General and local     FINDINGS:  Large pancreatic cyst at the tail which was adherent to the stomach and splenic hilum     SPECIMEN:  Distal pancreas and spleen to Pathology.      ESTIMATED BLOOD LOSS:  200 mL      COMPLICATIONS:  None known.      PROCEDURE:  Patient was identified in the holding area, taken to operating room, and placed supine on the operating room table. SCDs were placed.  General anesthesia was induced. Foley catheter was placed.  Pt was placed in the leaning spleen position on a beanbag, taking care to ensure appropriate padding.  The abdomen was prepped and draped in sterile fashion. Time-out was performed according to surgical safety check list. When all was correct, we continued.    The left subcostal region was anesthetized with local. A 5 mm Optiview trocar was placed under direct visualization. Pneumoperitoneum was achieved. A 5 cm upper midline incision was made and carried down through the fascia. An Alexis wound protector was placed and the gel port was attached. Three additional 5 mm ports were placed in the LUQ under direct visualization. The lienocolic ligament was taken down with the Harmonic. The lesser sac was opened with the Harmonic and all the adhesions were taken down. The stomach was densely adherent to the pancreatic cyst. The cyst was entered while mobilizing this area with some spillage of its contents. Once, the stomach was completely off the spleen, the inferior border of the pancreas was identified and this was opened up with the Harmonic. The pancreas was elevated off of the posterior retroperitoneum to facilitate identification of the splenic artery and vein.    Given the adherence of the spleen to the patient's pancreatic cyst  as well as the tortuosity of the splenic vessels as they approached the cyst, splenic preservation was not feasible. As such, the splenic vein was first encountered and dissected circumferentially away from the pancreas. To facilitate this an additional 5 mm port was placed, just left of midline and caudad to the hand port. A 12 mm trocar was inserted into the gel port. The splenic vein was then divided with an echelon white staple load. The splenic artery was similarly dissected circumferentially and divided with an echelon white staple load. The posterior and lateral attachments to the spleen were taken bluntly and with the Harmonic. The short gastric vessels were taken with the Harmonic. With the spleen detached, we turned to dividing the pancreas parenchyma. Umbilical tape was passed around the pancreas just distal to the divided splenic vessels to facilitate retraction. The pancreas was then divided slowly with the TA stapler green load. The specimen was then removed from the hand  port. The area was inspected for hemostasis.  The argon was used to assist with hemostasis.  A lap was placed on the pancreatic stump temporarily.    The specimen was evaluated on the backtable.  A frozen section was sent to pathology and determined not to be consistent with malignancy.    The abdomen was copiously irrigated and there was no sign of any additional bleeding.  Vistaseal  was  placed over the tail of the pancreas.  A 19 Blake drain was then passed into the abdomen through the hand port and pulled out through one of the other 5 mm trocars. This was placed in the appropriate location.  A 4 quadrant inspection was performed and no evidence of blood pooling or succus was seen.  No other gross pathology was seen.    At this point, the other 5 mm trocars were removed and the fascia from the hand port was closed with two running #1 PDS suture. The skin  of the hand port was reapproximated with interrupted 3-0 vicryl.  The skin of all the incisions was  then closed with 4-0 Monocryl in a subcuticular fashion and then covered with Dermabond.The patient tolerated the procedure well.  Pt was extubated and taken to the PACU in stable condition. Needle, sponge, and instrument counts were correct x2        I performed the key portions of the procedure and was present and scrubbed for the entire procedure other than final skin closure.    I have reviewed and agree with the operative note as documented by the resident.

## 2023-12-30 NOTE — Interval H&P Note (Signed)
 History and Physical Interval Note:  12/30/2023 8:19 AM  Blake Ward  has presented today for surgery, with the diagnosis of PANCREATIC MASS.  The various methods of treatment have been discussed with the patient and family. After consideration of risks, benefits and other options for treatment, the patient has consented to  Procedure(s) with comments: PANCREATECTOMY, LAPAROSCOPIC (N/A) - HAND ASSISTED LAPAROSCOPIC DISTAL PANCREATECTOMY SPLENECTOMY as a surgical intervention.  The patient's history has been reviewed, patient examined, no change in status, stable for surgery.  I have reviewed the patient's chart and labs.  Questions were answered to the patient's satisfaction.     [name]

## 2023-12-30 NOTE — Anesthesia Procedure Notes (Addendum)
 Procedure Name: Intubation Date/Time: 12/30/2023 8:44 AM  Performed by: Tommas Cena SQUIBB, RNPre-anesthesia Checklist: Patient identified, Emergency Drugs available, Suction available and Patient being monitored Patient Re-evaluated:Patient Re-evaluated prior to induction Oxygen Delivery Method: Circle system utilized Preoxygenation: Pre-oxygenation with 100% oxygen Induction Type: IV induction Ventilation: Mask ventilation without difficulty Laryngoscope Size: Mac and 4 Grade View: Grade I Tube type: Oral Tube size: 7.5 mm Number of attempts: 1 Airway Equipment and Method: Stylet Placement Confirmation: ETT inserted through vocal cords under direct vision, positive ETCO2 and breath sounds checked- equal and bilateral Secured at: 23 cm Tube secured with: Tape Dental Injury: Teeth and Oropharynx as per pre-operative assessment  Comments: Preformed by SRNA; CRNA and MDA present for procedure

## 2023-12-30 NOTE — Transfer of Care (Signed)
 Immediate Anesthesia Transfer of Care Note  Patient: Blake Ward  Procedure(s) Performed: PANCREATECTOMY, LAPAROSCOPIC, HAND ASSISTED LAPAROSCOPIC DISTAL PANCREATECTOMY SPLENECTOMY  Patient Location: PACU  Anesthesia Type:General  Level of Consciousness: alert , oriented, drowsy, and patient cooperative  Airway & Oxygen Therapy: Patient Spontanous Breathing  Post-op Assessment: Report given to RN, Post -op Vital signs reviewed and stable, and Patient moving all extremities X 4  Post vital signs: Reviewed and stable  Last Vitals:  Vitals Value Taken Time  BP 113/92 12/30/23 13:30  Temp    Pulse 100 12/30/23 13:34  Resp 14 12/30/23 13:34  SpO2 98 % 12/30/23 13:34  Vitals shown include unfiled device data.  Last Pain:  Vitals:   12/30/23 0708  TempSrc:   PainSc: 0-No pain         Complications: No notable events documented.

## 2023-12-30 NOTE — Anesthesia Procedure Notes (Signed)
 Arterial Line Insertion Start/End8/26/2025 7:10 AM, 12/30/2023 7:15 AM Performed by: Tommas Cena SQUIBB, RN  Patient location: Pre-op. Preanesthetic checklist: patient identified, IV checked, site marked, risks and benefits discussed, surgical consent, monitors and equipment checked, pre-op evaluation, timeout performed and anesthesia consent Lidocaine  1% used for infiltration Right, radial was placed Catheter size: 20 G Hand hygiene performed  and maximum sterile barriers used  Allen's test indicative of satisfactory collateral circulation Attempts: 1 Procedure performed without using ultrasound guided technique. Following insertion, dressing applied. Post procedure assessment: normal and unchanged  Patient tolerated the procedure well with no immediate complications. Additional procedure comments: Preformed by SRNA, CRNA at bedside .

## 2023-12-31 ENCOUNTER — Encounter (HOSPITAL_COMMUNITY): Payer: Self-pay | Admitting: General Surgery

## 2023-12-31 LAB — CBC
HCT: 34.1 % — ABNORMAL LOW (ref 39.0–52.0)
Hemoglobin: 12.2 g/dL — ABNORMAL LOW (ref 13.0–17.0)
MCH: 32.6 pg (ref 26.0–34.0)
MCHC: 35.8 g/dL (ref 30.0–36.0)
MCV: 91.2 fL (ref 80.0–100.0)
Platelets: 181 K/uL (ref 150–400)
RBC: 3.74 MIL/uL — ABNORMAL LOW (ref 4.22–5.81)
RDW: 12.2 % (ref 11.5–15.5)
WBC: 13.7 K/uL — ABNORMAL HIGH (ref 4.0–10.5)
nRBC: 0 % (ref 0.0–0.2)

## 2023-12-31 LAB — BASIC METABOLIC PANEL WITH GFR
Anion gap: 9 (ref 5–15)
BUN: 10 mg/dL (ref 8–23)
CO2: 23 mmol/L (ref 22–32)
Calcium: 8.5 mg/dL — ABNORMAL LOW (ref 8.9–10.3)
Chloride: 99 mmol/L (ref 98–111)
Creatinine, Ser: 0.82 mg/dL (ref 0.61–1.24)
GFR, Estimated: >60 mL/min (ref >60)
Glucose, Bld: 153 mg/dL — ABNORMAL HIGH (ref 70–99)
Potassium: 3.6 mmol/L (ref 3.5–5.1)
Sodium: 131 mmol/L — ABNORMAL LOW (ref 135–145)

## 2023-12-31 LAB — PHOSPHORUS: Phosphorus: 2.9 mg/dL (ref 2.5–4.6)

## 2023-12-31 LAB — MAGNESIUM: Magnesium: 1.7 mg/dL (ref 1.7–2.4)

## 2023-12-31 MED ORDER — TAMSULOSIN HCL 0.4 MG PO CAPS
0.4000 mg | ORAL_CAPSULE | Freq: Every day | ORAL | Status: DC
  Administered 2023-12-31 – 2024-01-04 (×5): 0.4 mg via ORAL
  Filled 2023-12-31 (×5): qty 1

## 2023-12-31 MED ORDER — CHLORHEXIDINE GLUCONATE CLOTH 2 % EX PADS
6.0000 | MEDICATED_PAD | Freq: Every day | CUTANEOUS | Status: DC
  Administered 2023-12-31: 6 via TOPICAL

## 2023-12-31 NOTE — TOC Initial Note (Signed)
 Transition of Care [facility]) - Initial/Assessment Note    Patient Details  Name: Blake Ward MRN: [id number] Date of Birth: [birth year]  [facility] CM/SW Contact:    Jeoffrey LITTIE Maranda ISRAEL Phone Number: 12/31/2023, 8:43 AM  Clinical Narrative:                 Pt admitted from home with spouse for pancreatectomy. No current TOC needs, please consult as needs arise.         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

## 2023-12-31 NOTE — Plan of Care (Signed)

## 2023-12-31 NOTE — Plan of Care (Signed)
   Problem: Education: Goal: Knowledge of General Education information will improve Description Including pain rating scale, medication(s)/side effects and non-pharmacologic comfort measures Outcome: Progressing

## 2023-12-31 NOTE — Progress Notes (Signed)
 Removed patient's foley emptied of urine from drainage bag.

## 2023-12-31 NOTE — Anesthesia Postprocedure Evaluation (Signed)
 Anesthesia Post Note  Patient: Blake Ward  Procedure(s) Performed: PANCREATECTOMY, LAPAROSCOPIC, HAND ASSISTED LAPAROSCOPIC DISTAL PANCREATECTOMY SPLENECTOMY     Patient location during evaluation: PACU Anesthesia Type: General Level of consciousness: awake and alert Pain management: pain level controlled Vital Signs Assessment: post-procedure vital signs reviewed and stable Respiratory status: spontaneous breathing, nonlabored ventilation, respiratory function stable and patient connected to nasal cannula oxygen Cardiovascular status: blood pressure returned to baseline and stable Postop Assessment: no apparent nausea or vomiting Anesthetic complications: no   No notable events documented.  Last Vitals:  Vitals:   12/31/23 0413 12/31/23 0729  BP: 130/71 127/70  Pulse: 79 76  Resp:  17  Temp: 36.6 C 36.8 C  SpO2: 97% 98%    Last Pain:  Vitals:   12/31/23 1155  TempSrc:   PainSc: 7                  Nerida Boivin L Daniela Hernan

## 2023-12-31 NOTE — Progress Notes (Signed)
 1 Day Post-Op   Subjective/Chief Complaint: Pain controlled this AM.  No n/v.  Tolerated clears.    Objective: Vital signs in last 24 hours: Temp:  [97.5 F (36.4 C)-98.3 F (36.8 C)] 97.6 F (36.4 C) (08/27 1557) Pulse Rate:  [76-97] 83 (08/27 1557) Resp:  [16-17] 17 (08/27 1557) BP: (127-151)/(65-87) 137/70 (08/27 1557) SpO2:  [96 %-100 %] 99 % (08/27 1557) Last BM Date :  (PTA)  Intake/Output from previous day: 08/26 0701 - 08/27 0700 In: 3094.2 [I.V.:2384.2; IV Piggyback:710.1] Out: 3830 [Urine:3210; Drains:370; Blood:250] Intake/Output this shift: Total I/O In: 724 [P.O.:724] Out: 3620 [Urine:3500; Drains:120]  Gen:  alert and oriented.  Sitting up in bed Resp:  breathing comfortably Abd:  soft, protuberant/baseline.  Incision c/d/I.  Jp serosang Ext:  warm, well perfused.    Lab Results:  Recent Labs    12/30/23 1823 12/31/23 0316  WBC 14.5* 13.7*  HGB 13.6 12.2*  HCT 38.9* 34.1*  PLT 208 181   BMET Recent Labs    12/30/23 1202 12/30/23 1823 12/31/23 0316  NA 135  --  131*  K 3.7  --  3.6  CL  --   --  99  CO2  --   --  23  GLUCOSE  --   --  153*  BUN  --   --  10  CREATININE  --  1.02 0.82  CALCIUM  --   --  8.5*   PT/INR No results for input(s): LABPROT, INR in the last 72 hours. ABG Recent Labs    12/30/23 1202  PHART 7.347*  HCO3 22.5    Studies/Results: No results found.  Anti-infectives: Anti-infectives (From admission, onward)    Start     Dose/Rate Route Frequency Ordered Stop   12/30/23 1730  ceFAZolin  (ANCEF ) IVPB 2g/100 mL premix        2 g 200 mL/hr over 30 Minutes Intravenous Every 8 hours 12/30/23 1644 12/30/23 1930   12/30/23 0645  ceFAZolin  (ANCEF ) IVPB 2g/100 mL premix        2 g 200 mL/hr over 30 Minutes Intravenous On call to O.R. 12/30/23 9365 12/30/23 1321       Assessment/Plan: s/p Procedure(s): PANCREATECTOMY, LAPAROSCOPIC, HAND ASSISTED LAPAROSCOPIC DISTAL PANCREATECTOMY SPLENECTOMY (N/A) complex  cyst POD 1 Add flomax  for h/o prostate cancer and prior urinary retention post foley removal D/c foley Advance diet Decrease iv fluids Ambulate Check amylase in drain  LOS: 1 day    [name] 12/31/2023

## 2024-01-01 LAB — CBC
HCT: 33.1 % — ABNORMAL LOW (ref 39.0–52.0)
Hemoglobin: 11.4 g/dL — ABNORMAL LOW (ref 13.0–17.0)
MCH: 32.4 pg (ref 26.0–34.0)
MCHC: 34.4 g/dL (ref 30.0–36.0)
MCV: 94 fL (ref 80.0–100.0)
Platelets: 212 K/uL (ref 150–400)
RBC: 3.52 MIL/uL — ABNORMAL LOW (ref 4.22–5.81)
RDW: 12.5 % (ref 11.5–15.5)
WBC: 16 K/uL — ABNORMAL HIGH (ref 4.0–10.5)
nRBC: 0 % (ref 0.0–0.2)

## 2024-01-01 LAB — BASIC METABOLIC PANEL WITH GFR
Anion gap: 8 (ref 5–15)
BUN: 13 mg/dL (ref 8–23)
CO2: 24 mmol/L (ref 22–32)
Calcium: 9 mg/dL (ref 8.9–10.3)
Chloride: 100 mmol/L (ref 98–111)
Creatinine, Ser: 1.17 mg/dL (ref 0.61–1.24)
GFR, Estimated: >60 mL/min (ref >60)
Glucose, Bld: 133 mg/dL — ABNORMAL HIGH (ref 70–99)
Potassium: 4.1 mmol/L (ref 3.5–5.1)
Sodium: 132 mmol/L — ABNORMAL LOW (ref 135–145)

## 2024-01-01 MED ORDER — PANTOPRAZOLE SODIUM 40 MG PO TBEC
40.0000 mg | DELAYED_RELEASE_TABLET | Freq: Every day | ORAL | Status: DC
  Administered 2024-01-01 – 2024-01-04 (×4): 40 mg via ORAL
  Filled 2024-01-01 (×4): qty 1

## 2024-01-01 MED ORDER — ALUM & MAG HYDROXIDE-SIMETH 200-200-20 MG/5ML PO SUSP: 30.0000 mL | Freq: Four times a day (QID) | ORAL | Status: DC | PRN

## 2024-01-01 NOTE — Progress Notes (Signed)
 Pt wife and daughter both receive teaching on how to care for the JP drain on discharge. Dressing is change and dated. Drainage is dark red.

## 2024-01-01 NOTE — Plan of Care (Signed)
  Problem: Education: Goal: Knowledge of General Education information will improve Description: Including pain rating scale, medication(s)/side effects and non-pharmacologic comfort measures Outcome: Progressing   Problem: Clinical Measurements: Goal: Cardiovascular complication will be avoided Outcome: Progressing   Problem: Activity: Goal: Risk for activity intolerance will decrease Outcome: Progressing   Problem: Nutrition: Goal: Adequate nutrition will be maintained Outcome: Progressing   Problem: Pain Managment: Goal: General experience of comfort will improve and/or be controlled Outcome: Progressing

## 2024-01-01 NOTE — Progress Notes (Signed)
 2 Days Post-Op   Subjective/Chief Complaint: Foley removed yesterday, able to void.  Complains of some indigestion today. Tolerated full liquids.    Objective: Vital signs in last 24 hours: Temp:  [97.6 F (36.4 C)-99 F (37.2 C)] 99 F (37.2 C) (08/28 0920) Pulse Rate:  [81-109] 83 (08/28 0920) Resp:  [17-18] 18 (08/28 0920) BP: (115-137)/(56-70) 115/62 (08/28 0920) SpO2:  [96 %-100 %] 100 % (08/28 0920) Last BM Date : 12/30/23  Intake/Output from previous day: 08/27 0701 - 08/28 0700 In: 960 [P.O.:960] Out: 4980 [Urine:4350; Drains:630] Intake/Output this shift: No intake/output data recorded.  Gen:  alert and oriented.  Sitting up in bed Resp:  breathing comfortably Abd:  soft, protuberant/baseline.  Incision c/d/I.  Jp serosang Ext:  warm, well perfused.    Lab Results:  Recent Labs    12/31/23 0316 01/01/24 0528  WBC 13.7* 16.0*  HGB 12.2* 11.4*  HCT 34.1* 33.1*  PLT 181 212   BMET Recent Labs    12/31/23 0316 01/01/24 0528  NA 131* 132*  K 3.6 4.1  CL 99 100  CO2 23 24  GLUCOSE 153* 133*  BUN 10 13  CREATININE 0.82 1.17  CALCIUM 8.5* 9.0   PT/INR No results for input(s): LABPROT, INR in the last 72 hours. ABG Recent Labs    12/30/23 1202  PHART 7.347*  HCO3 22.5    Studies/Results: No results found.  Anti-infectives: Anti-infectives (From admission, onward)    Start     Dose/Rate Route Frequency Ordered Stop   12/30/23 1730  ceFAZolin  (ANCEF ) IVPB 2g/100 mL premix        2 g 200 mL/hr over 30 Minutes Intravenous Every 8 hours 12/30/23 1644 12/30/23 1930   12/30/23 0645  ceFAZolin  (ANCEF ) IVPB 2g/100 mL premix        2 g 200 mL/hr over 30 Minutes Intravenous On call to O.R. 12/30/23 9365 12/30/23 1321       Assessment/Plan: s/p Procedure(s): PANCREATECTOMY, LAPAROSCOPIC, HAND ASSISTED LAPAROSCOPIC DISTAL PANCREATECTOMY SPLENECTOMY (N/A) complex cyst POD 2 Await final path  Flomax  for h/o prostate cancer and prior urinary  retention post foley removal  Advance diet Saline lock IVF Ambulate Check amylase in drain   LOS: 2 days    [name] 01/01/2024

## 2024-01-02 LAB — BASIC METABOLIC PANEL WITH GFR
Anion gap: 8 (ref 5–15)
BUN: 9 mg/dL (ref 8–23)
CO2: 24 mmol/L (ref 22–32)
Calcium: 9.1 mg/dL (ref 8.9–10.3)
Chloride: 102 mmol/L (ref 98–111)
Creatinine, Ser: 0.96 mg/dL (ref 0.61–1.24)
GFR, Estimated: >60 mL/min (ref >60)
Glucose, Bld: 126 mg/dL — ABNORMAL HIGH (ref 70–99)
Potassium: 4.2 mmol/L (ref 3.5–5.1)
Sodium: 134 mmol/L — ABNORMAL LOW (ref 135–145)

## 2024-01-02 LAB — CBC
HCT: 32.2 % — ABNORMAL LOW (ref 39.0–52.0)
Hemoglobin: 11.4 g/dL — ABNORMAL LOW (ref 13.0–17.0)
MCH: 32.9 pg (ref 26.0–34.0)
MCHC: 35.4 g/dL (ref 30.0–36.0)
MCV: 93.1 fL (ref 80.0–100.0)
Platelets: 210 K/uL (ref 150–400)
RBC: 3.46 MIL/uL — ABNORMAL LOW (ref 4.22–5.81)
RDW: 12.2 % (ref 11.5–15.5)
WBC: 16.7 K/uL — ABNORMAL HIGH (ref 4.0–10.5)
nRBC: 0 % (ref 0.0–0.2)

## 2024-01-02 LAB — SURGICAL PATHOLOGY

## 2024-01-02 MED ORDER — POLYETHYLENE GLYCOL 3350 17 G PO PACK
17.0000 g | PACK | Freq: Every day | ORAL | Status: DC
  Administered 2024-01-02 – 2024-01-04 (×3): 17 g via ORAL
  Filled 2024-01-02 (×3): qty 1

## 2024-01-02 NOTE — Plan of Care (Signed)
  Problem: Education: Goal: Knowledge of General Education information will improve Description: Including pain rating scale, medication(s)/side effects and non-pharmacologic comfort measures Outcome: Progressing   Problem: Clinical Measurements: Goal: Ability to maintain clinical measurements within normal limits will improve Outcome: Progressing Goal: Respiratory complications will improve Outcome: Progressing   Problem: Activity: Goal: Risk for activity intolerance will decrease Outcome: Progressing   Problem: Nutrition: Goal: Adequate nutrition will be maintained Outcome: Progressing   Problem: Elimination: Goal: Will not experience complications related to urinary retention Outcome: Progressing   Problem: Safety: Goal: Ability to remain free from injury will improve Outcome: Progressing

## 2024-01-02 NOTE — Progress Notes (Signed)
 3 Days Post-Op   Subjective/Chief Complaint: Passing gas.  Tolerating full liquids.  .    Objective: Vital signs in last 24 hours: Temp:  [98.3 F (36.8 C)-99.2 F (37.3 C)] 98.3 F (36.8 C) (08/29 0759) Pulse Rate:  [83-107] 93 (08/29 0759) Resp:  [16-18] 16 (08/29 0400) BP: (103-140)/(48-72) 103/48 (08/29 0759) SpO2:  [98 %-100 %] 98 % (08/29 0759) Last BM Date : 12/30/23  Intake/Output from previous day: 08/28 0701 - 08/29 0700 In: 1500 [P.O.:1500] Out: 50 [Drains:50] Intake/Output this shift: Total I/O In: 220 [P.O.:220] Out: -   Gen:  alert and oriented.  Sitting up in bed Resp:  breathing comfortably Abd:  soft, protuberant/baseline.  Incision c/d/I.  Jp serosang. Lower volume than before.  Ext:  warm, well perfused.    Lab Results:  Recent Labs    01/01/24 0528 01/02/24 0353  WBC 16.0* 16.7*  HGB 11.4* 11.4*  HCT 33.1* 32.2*  PLT 212 210   BMET Recent Labs    01/01/24 0528 01/02/24 0353  NA 132* 134*  K 4.1 4.2  CL 100 102  CO2 24 24  GLUCOSE 133* 126*  BUN 13 9  CREATININE 1.17 0.96  CALCIUM 9.0 9.1   PT/INR No results for input(s): LABPROT, INR in the last 72 hours. ABG Recent Labs    12/30/23 1202  PHART 7.347*  HCO3 22.5    Studies/Results: No results found.  Anti-infectives: Anti-infectives (From admission, onward)    Start     Dose/Rate Route Frequency Ordered Stop   12/30/23 1730  ceFAZolin  (ANCEF ) IVPB 2g/100 mL premix        2 g 200 mL/hr over 30 Minutes Intravenous Every 8 hours 12/30/23 1644 12/30/23 1930   12/30/23 0645  ceFAZolin  (ANCEF ) IVPB 2g/100 mL premix        2 g 200 mL/hr over 30 Minutes Intravenous On call to O.R. 12/30/23 9365 12/30/23 1321       Assessment/Plan: s/p Procedure(s): PANCREATECTOMY, LAPAROSCOPIC, HAND ASSISTED LAPAROSCOPIC DISTAL PANCREATECTOMY SPLENECTOMY (N/A) complex cyst POD 3 Await final path  Flomax  for h/o prostate cancer and prior urinary retention post foley  removal  Advance diet Ambulate Check amylase in drain Antihypertensives.    LOS: 3 days    [name] 01/02/2024

## 2024-01-02 NOTE — Progress Notes (Signed)
 Mobility Specialist Progress Note:    01/02/24 1455  Mobility  Activity Ambulated with assistance (In hallway)  Level of Assistance Independent after set-up  Assistive Device None  Distance Ambulated (ft) 1500 ft  Activity Response Tolerated well  Mobility Referral Yes  Mobility visit 1 Mobility  Mobility Specialist Start Time (ACUTE ONLY) 1342  Mobility Specialist Stop Time (ACUTE ONLY) 1409  Mobility Specialist Time Calculation (min) (ACUTE ONLY) 27 min   Received pt in bed and agreeable to mobility. No physical assistance needed. C/o abdominal discomfort, otherwise tolerated well. Returned to room without fault. Left in bed with personal belongings and call light within reach. Pt ambulated w/ MS per PT request. All needs met.   Lavanda Pollack Mobility Specialist  Please contact via [employer] or  Rehab Office [phone]

## 2024-01-02 NOTE — Care Management Important Message (Signed)
 Important Message  Patient Details  Name: Blake Ward MRN: [id number] Date of Birth: 1950/09/05   Important Message Given:  Yes - Medicare IM     Jon Cruel 01/02/2024, 4:36 PM

## 2024-01-03 LAB — URINALYSIS, ROUTINE W REFLEX MICROSCOPIC
Bacteria, UA: NONE SEEN
Bilirubin Urine: NEGATIVE
Glucose, UA: NEGATIVE mg/dL
Ketones, ur: NEGATIVE mg/dL
Leukocytes,Ua: NEGATIVE
Nitrite: NEGATIVE
Protein, ur: NEGATIVE mg/dL
Specific Gravity, Urine: 1.006 (ref 1.005–1.030)
pH: 6 (ref 5.0–8.0)

## 2024-01-03 LAB — CBC
HCT: 31.2 % — ABNORMAL LOW (ref 39.0–52.0)
Hemoglobin: 11 g/dL — ABNORMAL LOW (ref 13.0–17.0)
MCH: 32.6 pg (ref 26.0–34.0)
MCHC: 35.3 g/dL (ref 30.0–36.0)
MCV: 92.6 fL (ref 80.0–100.0)
Platelets: 264 K/uL (ref 150–400)
RBC: 3.37 MIL/uL — ABNORMAL LOW (ref 4.22–5.81)
RDW: 12.3 % (ref 11.5–15.5)
WBC: 11.5 K/uL — ABNORMAL HIGH (ref 4.0–10.5)
nRBC: 0 % (ref 0.0–0.2)

## 2024-01-03 LAB — BASIC METABOLIC PANEL WITH GFR
Anion gap: 8 (ref 5–15)
BUN: 9 mg/dL (ref 8–23)
CO2: 24 mmol/L (ref 22–32)
Calcium: 8.8 mg/dL — ABNORMAL LOW (ref 8.9–10.3)
Chloride: 102 mmol/L (ref 98–111)
Creatinine, Ser: 0.92 mg/dL (ref 0.61–1.24)
GFR, Estimated: >60 mL/min (ref >60)
Glucose, Bld: 127 mg/dL — ABNORMAL HIGH (ref 70–99)
Potassium: 4.3 mmol/L (ref 3.5–5.1)
Sodium: 134 mmol/L — ABNORMAL LOW (ref 135–145)

## 2024-01-03 MED ORDER — CEFAZOLIN SODIUM-DEXTROSE 2-4 GM/100ML-% IV SOLN
2.0000 g | Freq: Three times a day (TID) | INTRAVENOUS | Status: DC
  Administered 2024-01-03 – 2024-01-04 (×3): 2 g via INTRAVENOUS
  Filled 2024-01-03 (×3): qty 100

## 2024-01-03 NOTE — Plan of Care (Signed)

## 2024-01-03 NOTE — Progress Notes (Signed)
 Pt stated this AM that he has 4 episodes of having some small blot clots when he urinated since yesterday. Able to pass it out without any pain and difficulty. No further bleeding noted. Said that he will let Dr. Aron know about this when she do her rounds. Dr. Rubin also notified.

## 2024-01-03 NOTE — Progress Notes (Addendum)
 4 Days Post-Op   Subjective/Chief Complaint: Had a BM.  Feels a bit poorly this AM.  Had blood in urine last night.    Objective: Vital signs in last 24 hours: Temp:  [98.2 F (36.8 C)-98.9 F (37.2 C)] 98.2 F (36.8 C) (08/30 0757) Pulse Rate:  [44-88] 88 (08/30 0757) Resp:  [15-17] 15 (08/30 0757) BP: (114-136)/(58-64) 121/60 (08/30 0757) SpO2:  [98 %-100 %] 98 % (08/30 0757) Last BM Date : 12/30/23  Intake/Output from previous day: 08/29 0701 - 08/30 0700 In: 460 [P.O.:460] Out: 35 [Drains:35] Intake/Output this shift: Total I/O In: 240 [P.O.:240] Out: -   Gen:  alert and oriented.  ambulating Resp:  breathing comfortably Abd:  soft, protuberant/baseline.  Incision c/d/I.  Jp serosang. Cellulitis at drain site.  Ext:  warm, well perfused.    Lab Results:  Recent Labs    01/02/24 0353 01/03/24 0547  WBC 16.7* 11.5*  HGB 11.4* 11.0*  HCT 32.2* 31.2*  PLT 210 264   BMET Recent Labs    01/02/24 0353 01/03/24 0547  NA 134* 134*  K 4.2 4.3  CL 102 102  CO2 24 24  GLUCOSE 126* 127*  BUN 9 9  CREATININE 0.96 0.92  CALCIUM 9.1 8.8*   PT/INR No results for input(s): LABPROT, INR in the last 72 hours. ABG No results for input(s): PHART, HCO3 in the last 72 hours.  Invalid input(s): PCO2, PO2   Studies/Results: No results found.  Anti-infectives: Anti-infectives (From admission, onward)    Start     Dose/Rate Route Frequency Ordered Stop   12/30/23 1730  ceFAZolin  (ANCEF ) IVPB 2g/100 mL premix        2 g 200 mL/hr over 30 Minutes Intravenous Every 8 hours 12/30/23 1644 12/30/23 1930   12/30/23 0645  ceFAZolin  (ANCEF ) IVPB 2g/100 mL premix        2 g 200 mL/hr over 30 Minutes Intravenous On call to O.R. 12/30/23 9365 12/30/23 1321       Assessment/Plan: s/p Procedure(s): PANCREATECTOMY, LAPAROSCOPIC, HAND ASSISTED LAPAROSCOPIC DISTAL PANCREATECTOMY SPLENECTOMY (N/A) complex cyst POD 4 Benign path. 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   Flomax  for h/o prostate cancer and prior urinary retention post foley removal  Soft diet.  Ambulate Await drain amylase. If this comes back low, hope to d/c drain prior to d/c which should help resolve cellulitis.  Blood in urine likely secondary to foley trauma.  Check u/a.  Add ancef  for cellulitis around drain.  Antihypertensives.  Dispo:  hope to d/c tomorrow.  Feels poorly today and had clots in urine with cellulitis around drain.    LOS: 4 days    Blake Ward 01/03/2024

## 2024-01-04 LAB — BASIC METABOLIC PANEL WITH GFR
Anion gap: 10 (ref 5–15)
BUN: 8 mg/dL (ref 8–23)
CO2: 23 mmol/L (ref 22–32)
Calcium: 8.8 mg/dL — ABNORMAL LOW (ref 8.9–10.3)
Chloride: 102 mmol/L (ref 98–111)
Creatinine, Ser: 0.93 mg/dL (ref 0.61–1.24)
GFR, Estimated: >60 mL/min (ref >60)
Glucose, Bld: 123 mg/dL — ABNORMAL HIGH (ref 70–99)
Potassium: 3.6 mmol/L (ref 3.5–5.1)
Sodium: 135 mmol/L (ref 135–145)

## 2024-01-04 LAB — CBC
HCT: 30.8 % — ABNORMAL LOW (ref 39.0–52.0)
Hemoglobin: 10.7 g/dL — ABNORMAL LOW (ref 13.0–17.0)
MCH: 32.5 pg (ref 26.0–34.0)
MCHC: 34.7 g/dL (ref 30.0–36.0)
MCV: 93.6 fL (ref 80.0–100.0)
Platelets: 320 K/uL (ref 150–400)
RBC: 3.29 MIL/uL — ABNORMAL LOW (ref 4.22–5.81)
RDW: 12.3 % (ref 11.5–15.5)
WBC: 10.4 K/uL (ref 4.0–10.5)
nRBC: 0 % (ref 0.0–0.2)

## 2024-01-04 LAB — AMYLASE, BODY FLUID (OTHER): Amylase, Body Fluid: 1848 U/L

## 2024-01-04 MED ORDER — ACETAMINOPHEN 500 MG PO TABS: 1000.0000 mg | ORAL_TABLET | Freq: Three times a day (TID) | ORAL | 0 refills | Status: AC

## 2024-01-04 MED ORDER — TAMSULOSIN HCL 0.4 MG PO CAPS: 0.4000 mg | ORAL_CAPSULE | Freq: Every day | ORAL | 0 refills | Status: AC

## 2024-01-04 MED ORDER — AMOXICILLIN-POT CLAVULANATE 875-125 MG PO TABS
1.0000 | ORAL_TABLET | Freq: Two times a day (BID) | ORAL | Status: DC
  Administered 2024-01-04: 1 via ORAL
  Filled 2024-01-04: qty 1

## 2024-01-04 MED ORDER — DOCUSATE SODIUM 100 MG PO CAPS: 100.0000 mg | ORAL_CAPSULE | Freq: Two times a day (BID) | ORAL | 0 refills | Status: AC

## 2024-01-04 MED ORDER — AMOXICILLIN-POT CLAVULANATE 875-125 MG PO TABS: 1.0000 | ORAL_TABLET | Freq: Two times a day (BID) | ORAL | 0 refills | Status: AC

## 2024-01-04 MED ORDER — OXYCODONE HCL 5 MG PO TABS: 5.0000 mg | ORAL_TABLET | ORAL | 0 refills | Status: AC | PRN

## 2024-01-04 NOTE — Plan of Care (Signed)
  Problem: Education: Goal: Knowledge of General Education information will improve Description: Including pain rating scale, medication(s)/side effects and non-pharmacologic comfort measures 01/04/2024 1212 by Delores Kirsch, RN Outcome: Adequate for Discharge 01/04/2024 1212 by Delores Kirsch, RN Outcome: Progressing   Problem: Health Behavior/Discharge Planning: Goal: Ability to manage health-related needs will improve 01/04/2024 1212 by Delores Kirsch, RN Outcome: Adequate for Discharge 01/04/2024 1212 by Delores Kirsch, RN Outcome: Progressing   Problem: Clinical Measurements: Goal: Ability to maintain clinical measurements within normal limits will improve 01/04/2024 1212 by Delores Kirsch, RN Outcome: Adequate for Discharge 01/04/2024 1212 by Delores Kirsch, RN Outcome: Progressing Goal: Will remain free from infection 01/04/2024 1212 by Delores Kirsch, RN Outcome: Adequate for Discharge 01/04/2024 1212 by Delores Kirsch, RN Outcome: Progressing Goal: Diagnostic test results will improve Outcome: Adequate for Discharge Goal: Respiratory complications will improve Outcome: Adequate for Discharge Goal: Cardiovascular complication will be avoided Outcome: Adequate for Discharge   Problem: Activity: Goal: Risk for activity intolerance will decrease Outcome: Adequate for Discharge   Problem: Nutrition: Goal: Adequate nutrition will be maintained Outcome: Adequate for Discharge   Problem: Coping: Goal: Level of anxiety will decrease Outcome: Adequate for Discharge   Problem: Elimination: Goal: Will not experience complications related to bowel motility Outcome: Adequate for Discharge Goal: Will not experience complications related to urinary retention Outcome: Adequate for Discharge   Problem: Pain Managment: Goal: General experience of comfort will improve and/or be controlled Outcome: Adequate for Discharge   Problem: Safety: Goal: Ability to remain free from injury  will improve Outcome: Adequate for Discharge   Problem: Skin Integrity: Goal: Risk for impaired skin integrity will decrease Outcome: Adequate for Discharge

## 2024-01-04 NOTE — Progress Notes (Signed)
 Removed IV-CDI. Reviewed d/c paperwork with patient and family. Educated on how to empty JP drain and change dressing. Gave dressing supplies and new drainage cup. Wheeled stable patient and belongings to main entrance.

## 2024-01-04 NOTE — Plan of Care (Signed)

## 2024-01-04 NOTE — Discharge Instructions (Signed)
 [facility]      [facility], [place] [phone]  ABDOMINAL SURGERY: POST OP INSTRUCTIONS  Always review your discharge instruction sheet given to you by the facility where your surgery was performed.  IF YOU HAVE DISABILITY OR FAMILY LEAVE FORMS, YOU MUST BRING THEM TO THE OFFICE FOR PROCESSING.  PLEASE DO NOT GIVE THEM TO YOUR DOCTOR.  A prescription for pain medication may be given to you upon discharge.  Take your pain medication as prescribed, if needed.  If narcotic pain medicine is not needed, then you may take acetaminophen (Tylenol) or ibuprofen (Advil) as needed. Take your usually prescribed medications unless otherwise directed. If you need a refill on your pain medication, please contact your pharmacy. They will contact our office to request authorization.  Prescriptions will not be filled after 5pm or on week-ends. You should follow a light diet the first few days after arrival home, such as soup and crackers, pudding, etc.unless your doctor has advised otherwise. A high-fiber, low fat diet can be resumed as tolerated.   Be sure to include lots of fluids daily. Most patients will experience some swelling and bruising on the chest and neck area.  Ice packs will help.  Swelling and bruising can take several days to resolve Most patients will experience some swelling and bruising in the area of the incision. Ice pack will help. Swelling and bruising can take several days to resolve..  It is common to experience some constipation if taking pain medication after surgery.  Increasing fluid intake and taking a stool softener will usually help or prevent this problem from occurring.  A mild laxative (Milk of Magnesia or Miralax) should be taken according to package directions if there are no bowel movements after 48 hours.  You may have steri-strips (small skin tapes) in place directly over the incision.  These strips should be left on the skin for 10-14 days.  If your surgeon used skin glue on  the incision, you may shower in 48 hours.  The glue will flake off over the next 2-3 weeks.  Any sutures or staples will be removed at the office during your follow-up visit. You may find that a light gauze bandage over your incision may keep your staples from being rubbed or pulled. You may shower and replace the bandage daily. ACTIVITIES:  You may resume regular (light) daily activities beginning the next day--such as daily self-care, walking, climbing stairs--gradually increasing activities as tolerated.  You may have sexual intercourse when it is comfortable.  Refrain from any heavy lifting or straining until approved by your doctor. You may drive when you no longer are taking prescription pain medication, you can comfortably wear a seatbelt, and you can safely maneuver your car and apply brakes Return to Work: __________8 weeks if applicable_________________________ Bonita Quin should see your doctor in the office for a follow-up appointment approximately two weeks after your surgery.  Make sure that you call for this appointment within a day or two after you arrive home to insure a convenient appointment time. OTHER INSTRUCTIONS:  _____________________________________________________________ _____________________________________________________________  WHEN TO CALL YOUR DOCTOR: Fever over 101.0 Inability to urinate Nausea and/or vomiting Extreme swelling or bruising Continued bleeding from incision. Increased pain, redness, or drainage from the incision. Difficulty swallowing or breathing Muscle cramping or spasms. Numbness or tingling in hands or feet or around lips.  The clinic staff is available to answer your questions during regular business hours.  Please don't hesitate to call and ask to speak to  one of the nurses if you have concerns.  For further questions, please visit [email or web address]

## 2024-01-08 NOTE — Discharge Summary (Addendum)
 Physician Discharge Summary  Patient ID: Blake Ward MRN: [id number] DOB/AGE: 74-Nov-1952 74 y.o. 74 y.o.  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  This was uncomplicated.  Postop day 1 strata his drain output was relatively high.  His amylase was supposed to be sent but was not.  His Flomax  was added due to his history of prostate cancer and his Foley was removed.  He was successful in being able to void spontaneously.  His diet was advanced.  He was ambulatory.  His home antihypertensives were added back.  His pathology came back benign.  He developed some low-grade fevers and some cellulitis around his drain.  His drain volume output came down.  However, given the cellulitis around his drain and the previously high output, he was sent home with the drain.  He was started on antibiotics due to cellulitis around the drain.  He received 24 hours of Ancef  and was placed on Augmentin  for a week at discharge.  He was sent home on postop day 5 after drain teaching while he was tolerating a soft diet.  He was not using much pain medication.  He was kept on Flomax  for several weeks postdischarge.  Consults: None  Significant Diagnostic Studies: labs:  WBCs 10.4k prior to d/c.  Cr 0.93. HCT 30.7  K 3.6.  Pathology  A. DISTAL PANCREAS AND SPLEEN, PANCREATECTOMY:  - Benign lymphoepithelial cyst, 5 cm  - Benign unremarkable pancreatic parenchyma  - Benign unremarkable spleen  - Twelve benign lymph nodes  - No evidence of malignancy   Treatments: surgery: see above  Discharge Exam: Blood pressure 129/69, pulse 87, temperature 98 F (36.7 C),  temperature source Oral, resp. rate 16, height 6' (1.829 m), weight 87.1 kg, SpO2 100%. General appearance: alert, cooperative, and no distress Resp: breathing comfortably Cardio: regular rate and rhythm GI: soft, protuberant, but baseline, midline no cellulitis or drainage.  Cellulitis at LLQ drain site, but improving.  Drain serosang.   Extremities: extremities normal, atraumatic, no cyanosis or edema  Disposition: Discharge disposition: 01-Home or Self Care       Discharge Instructions     Call MD for:  difficulty breathing, headache or visual disturbances   Complete by: As directed    Call MD for:  hives   Complete by: As directed    Call MD for:  persistant nausea and vomiting   Complete by: As directed    Call MD for:  redness, tenderness, or signs of infection (pain, swelling, redness, odor or green/yellow discharge around incision site)   Complete by: As directed    Call MD for:  severe uncontrolled pain   Complete by: As directed    Call MD for:  temperature >100.4   Complete by: As directed    Change dressing (specify)   Complete by: As directed    Measure and record drain output daily.  Bring record to clinic.    Change dressing around drain as needed and after shower.  (Remove prior to shower).   Diet - low sodium heart healthy   Complete by: As directed    Increase activity slowly   Complete by: As directed       Allergies as of  01/04/2024   No Known Allergies      Medication List     STOP taking these medications    budesonide 0.5 MG/2ML nebulizer solution Commonly known as: PULMICORT       TAKE these medications    acetaminophen  500 MG tablet Commonly known as: TYLENOL  Take 2 tablets (1,000 mg total) by mouth 3 (three) times daily.   acidophilus Caps capsule Take 1 capsule by mouth daily before breakfast.   amLODipine  5 MG tablet Commonly known as: NORVASC  Take 5 mg by mouth every evening.   amoxicillin -clavulanate 875-125 MG  tablet Commonly known as: AUGMENTIN  Take 1 tablet by mouth every 12 (twelve) hours.   cetirizine 10 MG tablet Commonly known as: ZYRTEC Take 10 mg by mouth daily as needed for allergies.   docusate sodium  100 MG capsule Commonly known as: COLACE Take 1 capsule (100 mg total) by mouth 2 (two) times daily.   fluticasone  50 MCG/ACT nasal spray Commonly known as: FLONASE  Place 1 spray into both nostrils daily.   hydrochlorothiazide 25 MG tablet Commonly known as: HYDRODIURIL Take 25 mg by mouth daily.   irbesartan  300 MG tablet Commonly known as: AVAPRO  Take 300 mg by mouth daily.   MAGNESIUM  GLYCINATE PO Take 1 tablet by mouth daily.   montelukast  10 MG tablet Commonly known as: SINGULAIR  Take 10 mg by mouth daily with lunch.   multivitamin with minerals Tabs tablet Take 1 tablet by mouth daily.   oxyCODONE  5 MG immediate release tablet Commonly known as: Oxy IR/ROXICODONE  Take 1-2 tablets (5-10 mg total) by mouth every 4 (four) hours as needed for moderate pain (pain score 4-6), severe pain (pain score 7-10) or breakthrough pain.   psyllium 58.6 % packet Commonly known as: METAMUCIL Take 1 packet by mouth every evening.   SYSTANE BALANCE OP Place 1 drop into both eyes as needed (dry eyes).   tamsulosin  0.4 MG Caps capsule Commonly known as: FLOMAX  Take 1 capsule (0.4 mg total) by mouth daily.               Discharge Care Instructions  (From admission, onward)           Start     Ordered   01/04/24 0000  Change dressing (specify)       Comments: Measure and record drain output daily.  Bring record to clinic.    Change dressing around drain as needed and after shower.  (Remove prior to shower).   01/04/24 1010            Follow-up Information     Aron Shoulders, MD Follow up in 2 week(s).   Specialty: General Surgery Why: and possibly 1 week Contact information: [address] [place] [address] [place] [address] [phone]                  Signed: Shoulders Aron 01/08/2024, 2:31 PM

## 2024-02-19 DIAGNOSIS — E7800 Pure hypercholesterolemia, unspecified: Secondary | ICD-10-CM | POA: Diagnosis not present

## 2024-02-19 DIAGNOSIS — Z23 Encounter for immunization: Secondary | ICD-10-CM | POA: Diagnosis not present

## 2024-02-19 DIAGNOSIS — I10 Essential (primary) hypertension: Secondary | ICD-10-CM | POA: Diagnosis not present

## 2024-03-03 DIAGNOSIS — Z23 Encounter for immunization: Secondary | ICD-10-CM | POA: Diagnosis not present
